# Patient Record
Sex: Female | Born: 1970 | Race: White | Hispanic: No | Marital: Single | Smoking: Current every day smoker
Health system: Southern US, Community
[De-identification: ages and names within clinical notes are randomized; demographics above are authoritative.]

## PROBLEM LIST (undated history)

## (undated) DIAGNOSIS — Z8489 Family history of other specified conditions: Secondary | ICD-10-CM

## (undated) DIAGNOSIS — E119 Type 2 diabetes mellitus without complications: Secondary | ICD-10-CM

## (undated) DIAGNOSIS — G629 Polyneuropathy, unspecified: Secondary | ICD-10-CM

## (undated) DIAGNOSIS — M1990 Unspecified osteoarthritis, unspecified site: Secondary | ICD-10-CM

## (undated) DIAGNOSIS — N189 Chronic kidney disease, unspecified: Secondary | ICD-10-CM

## (undated) DIAGNOSIS — I10 Essential (primary) hypertension: Secondary | ICD-10-CM

## (undated) HISTORY — PX: CHOLECYSTECTOMY: SHX55

## (undated) HISTORY — PX: MOUTH SURGERY: SHX715

## (undated) HISTORY — PX: NECK SURGERY: SHX720

---

## 2004-11-20 ENCOUNTER — Emergency Department: Payer: Self-pay | Admitting: Emergency Medicine

## 2005-07-28 ENCOUNTER — Emergency Department: Payer: Self-pay | Admitting: Unknown Physician Specialty

## 2007-03-12 ENCOUNTER — Ambulatory Visit: Payer: Self-pay | Admitting: Specialist

## 2007-04-29 ENCOUNTER — Inpatient Hospital Stay (HOSPITAL_COMMUNITY): Admission: AD | Admit: 2007-04-29 | Discharge: 2007-04-30 | Payer: Self-pay | Admitting: Neurosurgery

## 2007-09-16 ENCOUNTER — Emergency Department: Payer: Self-pay | Admitting: Internal Medicine

## 2010-09-13 NOTE — Op Note (Signed)
NAME:  Cheryl Sullivan, Cheryl Sullivan               ACCOUNT NO.:  [id number]   MEDICAL RECORD NO.:  [id number]          PATIENT TYPE:  AMB   LOCATION:  SDS                          FACILITY:  [facility]   PHYSICIAN:  Henry A. Pool, M.D.    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   PROCEDURE NOTE:  C5-6 anterior cervical diskectomy and fusion with  allograft and anterior plating.   SURGEON:  Kathaleen Maser. Pool, M.D.   ASSISTANT:  Donalee Citrin, M.D.   ANESTHESIA:  General endotracheal anesthesia.   INDICATIONS FOR PROCEDURE:  Cheryl Sullivan is a 40 year old female with  history of neck and right upper extremity pain, paresthesias and  weakness consistent with a C6 radiculopathy.  Workup demonstrates  evidence of large right-sided C5-6 disk herniation with spinal cord and  C6 nerve root compression.  The patient has been counseled as to her  options.  She was desired to proceed with a C5-6 anterior cervical  diskectomy and fusion with allograft and anterior plating in hopes of  improving her symptoms.   DESCRIPTION OF PROCEDURE:  Patient taken to the operating room and  placed on the operating table in supine position.  After adequate level  of anesthesia was achieved, the patient positioned supine with neck  slightly extended and held in place with halter traction.  The patient's  anterior cervical region was prepped and draped sterilely.  A 10 blade  was used to make a linear skin incision overlying the C5-6 interspace.  This was carried down sharply to the platysma.  The platysma was then  divided vertically and dissection proceeded along the medial border of  the sternocleidomastoid muscle and carotid sheath.  Trachea, esophagus  were mobilized and  retracted towards the left.  Prevertebral fascia was  stripped off the anterior spinal column.  The longus coli muscle was  then elevated bilaterally using electrocautery.  Deep self-retaining  retractor was placed.  Intraoperative fluoroscopy was used and C5-6  level was confirmed.  Disk space was then incised with a 15 blade in  rectangular fashion.  Wide disk space clean-out was then achieved using  pituitary rongeurs, forward and back-biting Carlen curettes, Kerrison  rongeurs, high-speed drill.  All elements of the disk were removed down  to the level of the posterior annulus.  Microscope was then brought into  the field and used throughout the remainder of the diskectomy.  The  remaining aspects of the annulus and osteophytes removed using high-  speed drill down to the level of the posterior longitudinal ligament.  The posterior longitudinal ligament was then elevated and resected in  piecemeal fashion using Kerrison rongeurs.  Underlying thecal sac was  then identified.  Wide central decompression was then performed by  undercutting the bodies of C5 and C6.  Decompression proceeded at each  neural foramen.  Wide anterior foraminotomies were then performed  along  the course of the exiting C6 nerve roots bilaterally.  A large amount of  free disk herniation was encountered off to the right side at C5-6 which  was completely resected.  At this point, a very thorough decompression  had been achieved.  There was no evidence of jury to the thecal sac or  nerve roots.  Wound was then irrigated with antibiotic solution.  Gelfoam was placed topically for hemostasis and found to be good.  A 6  mm LifeNet allograft wedge was then impacted into place and recessed  approximately 1 mL from the anterior cortical margin.  A 25 mm Atlantis  anterior cervical plate was then placed through the C5-C6 levels.  This  then attached under fluoroscopic guidance using 13 mm variable angle  screws, two each  at both levels.  All four screws were given final  tightening and found to be solid within bone.  Locking screws engaged at  both levels.  Final images revealed good position of bone grafts and  hardware, proper operative level and normal alignment of spine.  Wound  was then irrigated with antibiotic solution.  Hemostasis was ensured  with bipolar electrocautery.  Wound was then closed in layers.  Steri-  Strips  and sterile dressings were applied.  There were no apparent  complications.  The patient tolerated the procedure well and she return  to the recovery room postoperative.           ______________________________  Kathaleen Maser. Pool, M.D.     HAP/MEDQ  D:  04/29/2007  T:  04/29/2007  Job:  [id number]

## 2011-02-03 LAB — CBC
MCV: 90.1
Platelets: UNDETERMINED
RBC: 4.25
RDW: 15.5

## 2011-02-03 LAB — DIFFERENTIAL
Basophils Relative: 0
Eosinophils Relative: 3
Lymphocytes Relative: 26

## 2015-09-22 ENCOUNTER — Emergency Department
Admission: EM | Admit: 2015-09-22 | Discharge: 2015-09-22 | Disposition: A | Discharge status: Home / Self Care | Payer: Self-pay | Attending: Emergency Medicine | Admitting: Emergency Medicine

## 2015-09-22 ENCOUNTER — Emergency Department: Payer: Self-pay

## 2015-09-22 ENCOUNTER — Encounter: Payer: Self-pay | Admitting: Emergency Medicine

## 2015-09-22 DIAGNOSIS — W108XXA Fall (on) (from) other stairs and steps, initial encounter: Secondary | ICD-10-CM | POA: Insufficient documentation

## 2015-09-22 DIAGNOSIS — Y939 Activity, unspecified: Secondary | ICD-10-CM | POA: Insufficient documentation

## 2015-09-22 DIAGNOSIS — S7001XA Contusion of right hip, initial encounter: Secondary | ICD-10-CM | POA: Insufficient documentation

## 2015-09-22 DIAGNOSIS — M1990 Unspecified osteoarthritis, unspecified site: Secondary | ICD-10-CM | POA: Insufficient documentation

## 2015-09-22 DIAGNOSIS — M25462 Effusion, left knee: Secondary | ICD-10-CM | POA: Insufficient documentation

## 2015-09-22 DIAGNOSIS — Y999 Unspecified external cause status: Secondary | ICD-10-CM | POA: Insufficient documentation

## 2015-09-22 DIAGNOSIS — F17200 Nicotine dependence, unspecified, uncomplicated: Secondary | ICD-10-CM | POA: Insufficient documentation

## 2015-09-22 DIAGNOSIS — S39012A Strain of muscle, fascia and tendon of lower back, initial encounter: Secondary | ICD-10-CM | POA: Insufficient documentation

## 2015-09-22 DIAGNOSIS — S40021A Contusion of right upper arm, initial encounter: Secondary | ICD-10-CM | POA: Insufficient documentation

## 2015-09-22 DIAGNOSIS — S7011XA Contusion of right thigh, initial encounter: Secondary | ICD-10-CM | POA: Insufficient documentation

## 2015-09-22 DIAGNOSIS — Y929 Unspecified place or not applicable: Secondary | ICD-10-CM | POA: Insufficient documentation

## 2015-09-22 HISTORY — DX: Unspecified osteoarthritis, unspecified site: M19.90

## 2015-09-22 LAB — POCT PREGNANCY, URINE: Preg Test, Ur: NEGATIVE

## 2015-09-22 MED ORDER — KETOROLAC TROMETHAMINE 60 MG/2ML IM SOLN
60.0000 mg | Freq: Once | INTRAMUSCULAR | Status: AC
  Administered 2015-09-22: 60 mg via INTRAMUSCULAR
  Filled 2015-09-22: qty 2

## 2015-09-22 MED ORDER — HYDROCODONE-ACETAMINOPHEN 5-325 MG PO TABS: 1.0000 | ORAL_TABLET | ORAL | Status: DC | PRN

## 2015-09-22 MED ORDER — IBUPROFEN 800 MG PO TABS: 800.0000 mg | ORAL_TABLET | Freq: Three times a day (TID) | ORAL | Status: DC | PRN

## 2015-09-22 MED ORDER — DIAZEPAM 5 MG PO TABS
5.0000 mg | ORAL_TABLET | Freq: Once | ORAL | Status: AC
  Administered 2015-09-22: 5 mg via ORAL
  Filled 2015-09-22: qty 1

## 2015-09-22 NOTE — ED Notes (Signed)
Discussed discharge instructions, prescriptions, and follow-up care with patient. No questions or concerns at this time. Pt stable at discharge.  

## 2015-09-22 NOTE — ED Provider Notes (Signed)
[facility] Emergency Department Provider Note  ____________________________________________  Time seen: Approximately 1:22 PM  I have reviewed the triage vital signs and the nursing notes.   HISTORY  Chief Complaint Fall    HPI Cheryl Sullivan is a 45 y.o. female who presents for evaluation of falling this morning the wet metal steps. Patient states that her legs when different directions and she is complaining of left knee pain, right forearm pain lumbar back pain and right hip and pelvis pain. Describes pain as 8/10. Extremely difficult to ambulate. Has not taken any medications over-the-counter this time.   Past Medical History  Diagnosis Date  . Arthritis     There are no active problems to display for this patient.   Past Surgical History  Procedure Laterality Date  . Cesarean section    . Cholecystectomy    . Neck surgery      Current Outpatient Rx  Name  Route  Sig  Dispense  Refill  . HYDROcodone-acetaminophen (NORCO) 5-325 MG tablet   Oral   Take 1-2 tablets by mouth every 4 (four) hours as needed for moderate pain.   20 tablet   0   . ibuprofen (ADVIL,MOTRIN) 800 MG tablet   Oral   Take 1 tablet (800 mg total) by mouth every 8 (eight) hours as needed.   30 tablet   0     Allergies Aspirin and Prednisone  History reviewed. No pertinent family history.  Social History Social History  Substance Use Topics  . Smoking status: Current Every Day Smoker -- 1.00 packs/day  . Smokeless tobacco: None  . Alcohol Use: No    Review of Systems Constitutional: No fever/chills Cardiovascular: Denies chest pain. Respiratory: Denies shortness of breath. Gastrointestinal: No abdominal pain.  No nausea, no vomiting.  No diarrhea.  No constipation. Genitourinary: Negative for dysuria. Musculoskeletal: Positive for left knee pain. Positive for right hip and pelvis pain. Positive for right forearm pain. Positive for lower back  pain. Skin: Negative for rash. Neurological: Negative for headaches, focal weakness or numbness.  10-point ROS otherwise negative.  ____________________________________________   PHYSICAL EXAM:  VITAL SIGNS: ED Triage Vitals  Enc Vitals Group     BP 09/22/15 1210 118/69 mmHg     Pulse Rate 09/22/15 1210 69     Resp 09/22/15 1210 18     Temp 09/22/15 1210 97.7 F (36.5 C)     Temp Source 09/22/15 1210 Oral     SpO2 09/22/15 1210 97 %     Weight 09/22/15 1210 270 lb (122.471 kg)     Height 09/22/15 1210 5\' 8"  (1.727 m)     Head Cir --      Peak Flow --      Pain Score 09/22/15 1220 7     Pain Loc --      Pain Edu? --      Excl. in [place]? --     Constitutional: Alert and oriented. Well appearing and in no acute distress.   Cardiovascular: Normal rate, regular rhythm. Grossly normal heart sounds.  Good peripheral circulation. Respiratory: Normal respiratory effort.  No retractions. Lungs CTAB. Musculoskeletal: Left knee with positive edema and tenderness. Able to flex knee at approximately 30. Right forearm with abrasions. Range of motion distally neurovascularly intact. Positive point tenderness to the lateral aspect of the right hip. Tenderness to the lumbar spine with straight leg raise negative bilaterally. Neurologic:  Normal speech and language. No gross focal neurologic deficits are  appreciated. No gait instability. Skin:  Skin is warm, dry and intact. No rash noted. Psychiatric: Mood and affect are normal. Speech and behavior are normal.  ____________________________________________   LABS (all labs ordered are listed, but only abnormal results are displayed)  Labs Reviewed  POC URINE PREG, ED  POCT PREGNANCY, URINE    RADIOLOGY   FINDINGS: Moderate knee joint effusion. 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 No advanced joint space narrowing. Question quadriceps tendon injury with small a avulsed component from the patella. No  other sign of fracture.  IMPRESSION: Joint effusion. Small calcification superior to the patella could indicate quadriceps tendon injury with tiny avulsion. __________________________________________   PROCEDURES  Procedure(s) performed: None  Critical Care performed: No  ____________________________________________   INITIAL IMPRESSION / ASSESSMENT AND PLAN / ED COURSE  Pertinent labs & imaging results that were available during my care of the patient were reviewed by me and considered in my medical decision making (see chart for details).  Status post fall with acute left knee effusion. Possible  Quadriceps tendon injury. Rx given for Vicodin 5/325 and ibuprofen 800. Patient follow-up with orthopedics on-call and return to the ER with any worsening symptomology. Patient voices no other emergency medical complaints at this time. ____________________________________________   FINAL CLINICAL IMPRESSION(S) / ED DIAGNOSES  Final diagnoses:  Knee effusion, left  Contusion, hip and thigh, right, initial encounter  Lumbar strain, initial encounter  Arm contusion, right, initial encounter     This chart was dictated using voice recognition software/Dragon. Despite best efforts to proofread, errors can occur which can change the meaning. Any change was purely unintentional.   Arlyss Repress, PA-C 09/22/15 [place], MD 09/23/15 1710

## 2015-09-22 NOTE — Discharge Instructions (Signed)
Cryotherapy °Cryotherapy means treatment with cold. Ice or gel packs can be used to reduce both pain and swelling. Ice is the most helpful within the first 24 to 48 hours after an injury or flare-up from overusing a muscle or joint. Sprains, strains, spasms, burning pain, shooting pain, and aches can all be eased with ice. Ice can also be used when recovering from surgery. Ice is effective, has very few side effects, and is safe for most people to use. °PRECAUTIONS  °Ice is not a safe treatment option for people with: °· Raynaud phenomenon. This is a condition affecting small blood vessels in the extremities. Exposure to cold may cause your problems to return. °· Cold hypersensitivity. There are many forms of cold hypersensitivity, including: °¨ Cold urticaria. Red, itchy hives appear on the skin when the tissues begin to warm after being iced. °¨ Cold erythema. This is a red, itchy rash caused by exposure to cold. °¨ Cold hemoglobinuria. Red blood cells break down when the tissues begin to warm after being iced. The hemoglobin that carry oxygen are passed into the urine because they cannot combine with blood proteins fast enough. °· Numbness or altered sensitivity in the area being iced. °If you have any of the following conditions, do not use ice until you have discussed cryotherapy with your caregiver: °· Heart conditions, such as arrhythmia, angina, or chronic heart disease. °· High blood pressure. °· Healing wounds or open skin in the area being iced. °· Current infections. °· Rheumatoid arthritis. °· Poor circulation. °· Diabetes. °Ice slows the blood flow in the region it is applied. This is beneficial when trying to stop inflamed tissues from spreading irritating chemicals to surrounding tissues. However, if you expose your skin to cold temperatures for too long or without the proper protection, you can damage your skin or nerves. Watch for signs of skin damage due to cold. °HOME CARE INSTRUCTIONS °Follow  these tips to use ice and cold packs safely. °· Place a dry or damp towel between the ice and skin. A damp towel will cool the skin more quickly, so you may need to shorten the time that the ice is used. °· For a more rapid response, add gentle compression to the ice. °· Ice for no more than 10 to 20 minutes at a time. The bonier the area you are icing, the less time it will take to get the benefits of ice. °· Check your skin after 5 minutes to make sure there are no signs of a poor response to cold or skin damage. °· Rest 20 minutes or more between uses. °· Once your skin is numb, you can end your treatment. You can test numbness by very lightly touching your skin. The touch should be so light that you do not see the skin dimple from the pressure of your fingertip. When using ice, most people will feel these normal sensations in this order: cold, burning, aching, and numbness. °· Do not use ice on someone who cannot communicate their responses to pain, such as small children or people with dementia. °HOW TO MAKE AN ICE PACK °Ice packs are the most common way to use ice therapy. Other methods include ice massage, ice baths, and cryosprays. Muscle creams that cause a cold, tingly feeling do not offer the same benefits that ice offers and should not be used as a substitute unless recommended by your caregiver. °To make an ice pack, do one of the following: °· Place crushed ice or a   bag of frozen vegetables in a sealable plastic bag. Squeeze out the excess air. Place this bag inside another plastic bag. Slide the bag into a pillowcase or place a damp towel between your skin and the bag.  Mix 3 parts water with 1 part rubbing alcohol. Freeze the mixture in a sealable plastic bag. When you remove the mixture from the freezer, it will be slushy. Squeeze out the excess air. Place this bag inside another plastic bag. Slide the bag into a pillowcase or place a damp towel between your skin and the bag. SEEK MEDICAL CARE  IF:  You develop white spots on your skin. This may give the skin a blotchy (mottled) appearance.  Your skin turns blue or pale.  Your skin becomes waxy or hard.  Your swelling gets worse. MAKE SURE YOU:   Understand these instructions.  Will watch your condition.  Will get help right away if you are not doing well or get worse.   This information is not intended to replace advice given to you by your health care provider. Make sure you discuss any questions you have with your health care provider.   Document Released: 12/12/2010 Document Revised: 05/08/2014 Document Reviewed: 12/12/2010 Elsevier Interactive Patient Education 2016 [employer]  Iliac Crest Contusion An iliac crest contusion is a deep bruise of your hip bone (hip pointer). Contusions are the result of an injury that caused bleeding under the skin. The contusion may turn blue, purple, or yellow. Minor injuries will give you a painless contusion, but more severe contusions may stay painful and swollen for a few weeks.  CAUSES  An iliac crest contusion is usually caused by a blow to the top of your hip pointer. The injury most often comes from contact sports.  SYMPTOMS   Swelling and redness of the hip area.  Bruising of the hip area.  Tenderness or soreness of the hip. DIAGNOSIS  The diagnosis can be made by taking your history and doing a physical exam. You may need an X-ray of your hip pointer to look for a broken bone (fracture). TREATMENT  Often, the best treatment for an iliac crest contusion is resting, icing, elevating, and applying cold compresses to the injured area. Over-the-counter medicines may also be recommended for pain control. Crutches may be used if walking is very painful. Some people need physical therapy to help with range of motion and strengthening.  HOME CARE INSTRUCTIONS   Put ice on the injured area.  Put ice in a plastic bag.  Place a towel between your skin and the bag.  Leave  the ice on for 15-20 minutes, 03-04 times a day.  Only take over-the-counter or prescription medicines for pain, discomfort, or fever as directed by your caregiver. Your caregiver may recommend avoiding anti-inflammatory medicines (aspirin, ibuprofen, and naproxen) for 48 hours because these medicines may increase bruising.  Keep your leg straightened (extended) when possible.  Walk or move around as the pain allows, or as directed by your caregiver. Use crutches if your caregiver recommends them.  Apply compression wraps as directed by your caregiver. You may remove the wraps for sleeping, showers, and baths. SEEK IMMEDIATE MEDICAL CARE IF:   You have increased bruising or swelling.  You have pain that is getting worse.  Your swelling or pain is not relieved with medicines.  Your toes get cold. MAKE SURE YOU:   Understand these instructions.  Will watch your condition.  Will get help right away if you are not  doing well or get worse.   This information is not intended to replace advice given to you by your health care provider. Make sure you discuss any questions you have with your health care provider.   Document Released: 01/10/2001 Document Revised: 07/10/2011 Document Reviewed: 09/02/2014 Elsevier Interactive Patient Education 2016 [place] therapy can help ease sore, stiff, injured, and tight muscles and joints. Heat relaxes your muscles, which may help ease your pain.  RISKS AND COMPLICATIONS If you have any of the following conditions, do not use heat therapy unless your health care provider has approved:  Poor circulation.  Healing wounds or scarred skin in the area being treated.  Diabetes, heart disease, or high blood pressure.  Not being able to feel (numbness) the area being treated.  Unusual swelling of the area being treated.  Active infections.  Blood clots.  Cancer.  Inability to communicate pain. This may include young  children and people who have problems with their brain function (dementia).  Pregnancy. Heat therapy should only be used on old, pre-existing, or long-lasting (chronic) injuries. Do not use heat therapy on new injuries unless directed by your health care provider. HOW TO USE HEAT THERAPY There are several different kinds of heat therapy, including:  Moist heat pack.  Warm water bath.  Hot water bottle.  Electric heating pad.  Heated gel pack.  Heated wrap.  Electric heating pad. Use the heat therapy method suggested by your health care provider. Follow your health care provider's instructions on when and how to use heat therapy. GENERAL HEAT THERAPY RECOMMENDATIONS  Do not sleep while using heat therapy. Only use heat therapy while you are awake.  Your skin may turn pink while using heat therapy. Do not use heat therapy if your skin turns red.  Do not use heat therapy if you have new pain.  High heat or long exposure to heat can cause burns. Be careful when using heat therapy to avoid burning your skin.  Do not use heat therapy on areas of your skin that are already irritated, such as with a rash or sunburn. SEEK MEDICAL CARE IF:  You have blisters, redness, swelling, or numbness.  You have new pain.  Your pain is worse. MAKE SURE YOU:  Understand these instructions.  Will watch your condition.  Will get help right away if you are not doing well or get worse.   This information is not intended to replace advice given to you by your health care provider. Make sure you discuss any questions you have with your health care provider.   Document Released: 07/10/2011 Document Revised: 05/08/2014 Document Reviewed: 06/10/2013 Elsevier Interactive Patient Education 2016 [employer]  Gluteal Strain A gluteal strain happens when the muscles in the buttocks (gluteal muscles) are overstretched or torn. A tear can be partial or complete. A gluteal strain can cause pain and  stiffness in your buttocks, legs, and lower back. A strain might be referred to as "pulling a muscle." The severity of a gluteal strain is rated in degrees. First-degree strains have the least amount of muscle tearing and pain. Second-degree and third-degrees strains have increasingly more tearing and pain.  CAUSES  There are many possible causes of gluteal strain, such as:   Stretching the muscles too far.   Putting too much stress on the muscles before they are warmed up.   Overusing the muscles.   Repetitive muscle movements over long periods of time.   Injury.  RISK  FACTORS This condition is more likely to develop in:   People who are in cold weather.   People who are physically tired.  People with poor strength and flexibility.   People who do not warm up properly before physical activity.   People who do exercises or play sports with sudden bursts of activity.  People who have poor exercise technique. SYMPTOMS  Symptoms of this condition include:  Pain in the buttocks, especially when moving the legs. Pain may spread to the lower back or the legs.  Bruising and swelling of the buttocks.  Tenderness, weakness, or stiffness in the buttocks.  Muscle spasms. DIAGNOSIS  This condition is diagnosed based on a physical exam and medical history. Your health care provider may do some range of motion exercises with you. You may have tests, such as MRI or X-rays.  Your strain may be rated based on how severe it is. The ratings are:   Grade 1 strain (mild). Your muscles are overstretched. You might have very small muscle tears. This type of strain generally heals in about 1 week.   Grade 2 strain (moderate). Your muscles are partially torn. This may take 1 to 2 months to heal.  Grade 3 strain (severe). Your muscles are completely torn. A severe strain can take more than three months to heal. Grade 3 gluteal strains are rare. TREATMENT  Treatment for this  condition includes resting, icing, and raising (elevating) the injured area as much as possible. Your health care provider may recommend over-the-counter pain medicines. If your gluteal strain is severe or very painful, your health care provider may prescribe pain medicines or physical therapy. Surgery for severe strains is rare. HOME CARE INSTRUCTIONS   Take over-the-counter and prescription medicines only as told by your health care provider.  Do not drive or operate heavy machinery while taking prescription pain medicine.  Return to your normal activities as told by your health care provider. Ask your health care provider what activities are safe for you.  Rest your gluteal muscles as much as possible, especially for the first 2-3 days.  Begin exercising or stretching as told by your health care provider.  If directed, apply ice to the injured area:  Put ice in a plastic bag.  Place a towel between your skin and the bag.  Leave the ice on for 20 minutes, 2-3 times per day.  Keep all follow-up visits as told by your health care provider. This is important. PREVENTION   Warm up and stretch before physical activity.   Stretch after physical activity.   Learn and use correct techniques for exercising and playing sports.  Avoid difficult physical activities if your muscles are tired or sore.   Strengthen your gluteal muscles and the surrounding muscles.   Develop your flexibility by stretching at least once a day.  SEEK MEDICAL CARE IF:   You have pain or swelling that gets worse or does not get better with medicine.   You have "shooting" pain that moves through your leg.  SEEK IMMEDIATE MEDICAL CARE IF:  You develop weakness in part of your body.   You lose feeling in part of your body.  You have severe pain.   You are unable to walk.  You have difficulty controlling your bladder or bowel movements.   This information is not intended to replace advice given  to you by your health care provider. Make sure you discuss any questions you have with your health care provider.  Document Released: 02/12/2009 Document Revised: 01/06/2015 Document Reviewed: 09/02/2014 Elsevier Interactive Patient Education 2016 [employer] Inc.  Knee Effusion Knee effusion means that you have excess fluid in your knee joint. This can cause pain and swelling in your knee. This may make your knee more difficult to bend and move. That is because there is increased pain and pressure in the joint. If there is fluid in your knee, it often means that something is wrong inside your knee, such as severe arthritis, abnormal inflammation, or an infection. Another common cause of knee effusion is an injury to the knee muscles, ligaments, or cartilage. HOME CARE INSTRUCTIONS  Use crutches as directed by your health care provider.  Wear a knee brace as directed by your health care provider.  Apply ice to the swollen area:  Put ice in a plastic bag.  Place a towel between your skin and the bag.  Leave the ice on for 20 minutes, 2-3 times per day.  Keep your knee raised (elevated) when you are sitting or lying down.  Take medicines only as directed by your health care provider.  Do any rehabilitation or strengthening exercises as directed by your health care provider.  Rest your knee as directed by your health care provider. You may start doing your normal activities again when your health care provider approves.   Keep all follow-up visits as directed by your health care provider. This is important. SEEK MEDICAL CARE IF:  You have ongoing (persistent) pain in your knee. SEEK IMMEDIATE MEDICAL CARE IF:  You have increased swelling or redness of your knee.  You have severe pain in your knee.  You have a fever.   This information is not intended to replace advice given to you by your health care provider. Make sure you discuss any questions you have with your health care  provider.   Document Released: 07/08/2003 Document Revised: 05/08/2014 Document Reviewed: 12/01/2013 Elsevier Interactive Patient Education [employer].

## 2015-09-22 NOTE — ED Notes (Signed)
Pt states she had a fall this morning, slipped on wet metal steps, c/o pain for R arm, L knee, and R hip where she landed and L lower back that twisted with fall. Swelling noted to L knee. Denies LOC, denies head involvement.

## 2015-09-22 NOTE — ED Notes (Signed)
Pt reports that she fell this morning and has pain to her right arm, lower back left side, and right hip. Pt denies LOC. Pt alert & oriented with NAD noted.

## 2016-02-15 ENCOUNTER — Encounter: Payer: Self-pay | Admitting: Emergency Medicine

## 2016-02-15 ENCOUNTER — Emergency Department
Admission: EM | Admit: 2016-02-15 | Discharge: 2016-02-15 | Disposition: A | Discharge status: Home / Self Care | Payer: Medicaid Other | Attending: Emergency Medicine | Admitting: Emergency Medicine

## 2016-02-15 DIAGNOSIS — B349 Viral infection, unspecified: Secondary | ICD-10-CM | POA: Insufficient documentation

## 2016-02-15 DIAGNOSIS — F17200 Nicotine dependence, unspecified, uncomplicated: Secondary | ICD-10-CM | POA: Insufficient documentation

## 2016-02-15 DIAGNOSIS — Z791 Long term (current) use of non-steroidal anti-inflammatories (NSAID): Secondary | ICD-10-CM | POA: Insufficient documentation

## 2016-02-15 LAB — INFLUENZA PANEL BY PCR (TYPE A & B)
H1N1FLUPCR: NOT DETECTED
INFLAPCR: NEGATIVE
INFLBPCR: NEGATIVE

## 2016-02-15 MED ORDER — PROMETHAZINE-DM 6.25-15 MG/5ML PO SYRP: 5.0000 mL | ORAL_SOLUTION | Freq: Four times a day (QID) | ORAL | 0 refills | Status: DC | PRN

## 2016-02-15 MED ORDER — IBUPROFEN 600 MG PO TABS: 600.0000 mg | ORAL_TABLET | Freq: Three times a day (TID) | ORAL | 0 refills | Status: DC | PRN

## 2016-02-15 NOTE — ED Provider Notes (Signed)
[facility] Emergency Department Provider Note   ____________________________________________   None    (approximate)  I have reviewed the triage vital signs and the nursing notes.   HISTORY  Chief Complaint No chief complaint on file.   HPI Cheryl Sullivan is a 45 y.o. female with one day history of nasal congestion, non-productive cough, and N/V/D. Pt has decreased appetite, but denies any hematemesis, hematochezia, or dysuria.  Patient reports a subjective fever, body aches, and mild headache relieved with OTC pain medication. She reports several sick contacts (daughter with pneumonia and other daughter with bronchitis) and wanted to "get checked out"   Past Medical History:  Diagnosis Date  . Arthritis     There are no active problems to display for this patient.   Past Surgical History:  Procedure Laterality Date  . CESAREAN SECTION    . CHOLECYSTECTOMY    . NECK SURGERY      Prior to Admission medications   Medication Sig Start Date End Date Taking? Authorizing Provider  HYDROcodone-acetaminophen (NORCO) 5-325 MG tablet Take 1-2 tablets by mouth every 4 (four) hours as needed for moderate pain. 09/22/15   Pierce Crane Beers, PA-C  ibuprofen (ADVIL,MOTRIN) 600 MG tablet Take 1 tablet (600 mg total) by mouth every 8 (eight) hours as needed. 02/15/16   Sable Feil, PA-C  ibuprofen (ADVIL,MOTRIN) 800 MG tablet Take 1 tablet (800 mg total) by mouth every 8 (eight) hours as needed. 09/22/15   Pierce Crane Beers, PA-C  promethazine-dextromethorphan (PROMETHAZINE-DM) 6.25-15 MG/5ML syrup Take 5 mLs by mouth 4 (four) times daily as needed for cough. 02/15/16   Sable Feil, PA-C    Allergies Aspirin and Prednisone  No family history on file.  Social History Social History  Substance Use Topics  . Smoking status: Current Every Day Smoker    Packs/day: 1.00  . Smokeless tobacco: Never Used  . Alcohol use No    Review of  Systems Constitutional: Subjective fever and positive for body aches and chills Eyes: No visual changes. ENT: No sore throat  Cardiovascular: Denies chest pain. Respiratory: Positive for cough Gastrointestinal: Mild abdominal pain. Positive nausea, vomiting, and diarrhea.   Genitourinary: Negative for dysuria. Neurological: Positive for mild headache. No focal weakness or numbness. 10-point ROS otherwise negative.  ____________________________________________   PHYSICAL EXAM:  VITAL SIGNS: ED Triage Vitals  Enc Vitals Group     BP 02/15/16 1304 101/70     Pulse Rate 02/15/16 1304 62     Resp 02/15/16 1304 18     Temp 02/15/16 1304 98.1 F (36.7 C)     Temp Source 02/15/16 1304 Oral     SpO2 02/15/16 1304 99 %     Weight 02/15/16 1308 260 lb (117.9 kg)     Height 02/15/16 1308 5\' 8"  (1.727 m)     Head Circumference --      Peak Flow --      Pain Score --      Pain Loc --      Pain Edu? --      Excl. in [place]? --     Constitutional: Alert and oriented. Well appearing and in no acute distress. Eyes: Conjunctivae are normal. PERRL. EOMI. Head: Atraumatic. Nose: No congestion/rhinnorhea. Mouth/Throat: Mucous membranes are moist.  Oropharynx mild erythema, no exudates noted Neck: No stridor. Hematological/Lymphatic/Immunilogical: No cervical lymphadenopathy. Cardiovascular: Normal rate, regular rhythm. Grossly normal heart sounds.  Good peripheral circulation. Respiratory: Normal respiratory effort.  No retractions.  CTABL Gastrointestinal: Soft and nontender. No distention.  Neurologic:  Normal speech and language. No gross focal neurologic deficits are appreciated. No gait instability. Skin:  Skin is warm, dry and intact. No rash noted. Psychiatric: Mood and affect are normal. Speech and behavior are normal.  ____________________________________________   LABS (all labs ordered are listed, but only abnormal results are displayed)  Labs Reviewed  INFLUENZA PANEL BY PCR  (TYPE A & B, H1N1)   ____________________________________________  EKG  N/A ____________________________________________  RADIOLOGY  N/A ____________________________________________   PROCEDURES  Procedure(s) performed: None  Procedures  Critical Care performed: No  ____________________________________________   INITIAL IMPRESSION / ASSESSMENT AND PLAN / ED COURSE  Pertinent labs & imaging results that were available during my care of the patient were reviewed by me and considered in my medical decision making (see chart for details). Discussed negative rapid flu test results with patient.  Viral illness 1. Influenza PCR- negative  2. Continue supportive care with rest and fluids. Patient advised to eat bland diet as tolerated 3. Phenergan DM for congestion/cough and nausea  4. Ibuprofen for body aches and fever  5. Patient was given work note     Clinical Course     ____________________________________________   FINAL CLINICAL IMPRESSION(S) / ED DIAGNOSES  Final diagnoses:  Viral illness      NEW MEDICATIONS STARTED DURING THIS VISIT:  New Prescriptions   IBUPROFEN (ADVIL,MOTRIN) 600 MG TABLET    Take 1 tablet (600 mg total) by mouth every 8 (eight) hours as needed.   PROMETHAZINE-DEXTROMETHORPHAN (PROMETHAZINE-DM) 6.25-15 MG/5ML SYRUP    Take 5 mLs by mouth 4 (four) times daily as needed for cough.     Note:  This document was prepared using Dragon voice recognition software and may include unintentional dictation errors.   Sable Feil, PA-C 02/15/16 [place], MD 02/16/16 [phone]

## 2016-02-15 NOTE — ED Triage Notes (Signed)
States she is having some chest congestion with cough  States she developed some n/v/d yesterday am around 3 am.. Last time vomited was at 6am  Last diarrhea was about 12 n

## 2016-04-05 ENCOUNTER — Encounter: Payer: Self-pay | Admitting: Emergency Medicine

## 2016-04-05 ENCOUNTER — Emergency Department: Payer: Self-pay

## 2016-04-05 ENCOUNTER — Emergency Department
Admission: EM | Admit: 2016-04-05 | Discharge: 2016-04-05 | Disposition: A | Discharge status: Home / Self Care | Payer: Self-pay | Attending: Emergency Medicine | Admitting: Emergency Medicine

## 2016-04-05 DIAGNOSIS — Z79899 Other long term (current) drug therapy: Secondary | ICD-10-CM | POA: Insufficient documentation

## 2016-04-05 DIAGNOSIS — Z791 Long term (current) use of non-steroidal anti-inflammatories (NSAID): Secondary | ICD-10-CM | POA: Insufficient documentation

## 2016-04-05 DIAGNOSIS — F17200 Nicotine dependence, unspecified, uncomplicated: Secondary | ICD-10-CM | POA: Insufficient documentation

## 2016-04-05 DIAGNOSIS — M25461 Effusion, right knee: Secondary | ICD-10-CM

## 2016-04-05 DIAGNOSIS — M25561 Pain in right knee: Secondary | ICD-10-CM

## 2016-04-05 DIAGNOSIS — M1711 Unilateral primary osteoarthritis, right knee: Secondary | ICD-10-CM | POA: Insufficient documentation

## 2016-04-05 MED ORDER — MELOXICAM 15 MG PO TABS
15.0000 mg | ORAL_TABLET | Freq: Every day | ORAL | 0 refills | Status: DC
Start: 2016-04-05 — End: 2016-10-04

## 2016-04-05 NOTE — ED Provider Notes (Signed)
[facility] Emergency Department Provider Note  ____________________________________________  Time seen: Approximately 3:51 PM  I have reviewed the triage vital signs and the nursing notes.   HISTORY  Chief Complaint Knee Pain    HPI Cheryl Sullivan is a 45 y.o. female who presents emergency department complaining of several weeks' history of right knee pain. Patient states that she is on her feet a lot at work and will have a sharp pain to the medial aspect of the kneewhile she is walking. Patient does report that area is mildly swollen when compared with the unaffected extremity. Patient instructed Tylenol which does help somewhat with pain. Patient denies any direct trauma to knee. No other complaints at this time.   Past Medical History:  Diagnosis Date  . Arthritis     There are no active problems to display for this patient.   Past Surgical History:  Procedure Laterality Date  . CESAREAN SECTION    . CHOLECYSTECTOMY    . NECK SURGERY      Prior to Admission medications   Medication Sig Start Date End Date Taking? Authorizing Provider  HYDROcodone-acetaminophen (NORCO) 5-325 MG tablet Take 1-2 tablets by mouth every 4 (four) hours as needed for moderate pain. 09/22/15   Pierce Crane Beers, PA-C  ibuprofen (ADVIL,MOTRIN) 600 MG tablet Take 1 tablet (600 mg total) by mouth every 8 (eight) hours as needed. 02/15/16   Sable Feil, PA-C  ibuprofen (ADVIL,MOTRIN) 800 MG tablet Take 1 tablet (800 mg total) by mouth every 8 (eight) hours as needed. 09/22/15   Pierce Crane Beers, PA-C  meloxicam (MOBIC) 15 MG tablet Take 1 tablet (15 mg total) by mouth daily. 04/05/16   Charline Bills Aliviyah Malanga, PA-C  promethazine-dextromethorphan (PROMETHAZINE-DM) 6.25-15 MG/5ML syrup Take 5 mLs by mouth 4 (four) times daily as needed for cough. 02/15/16   Sable Feil, PA-C    Allergies Aspirin and Prednisone  No family history on file.  Social History Social History   Substance Use Topics  . Smoking status: Current Every Day Smoker    Packs/day: 1.00  . Smokeless tobacco: Never Used  . Alcohol use No     Review of Systems  Constitutional: No fever/chills Eyes: No visual changes. No discharge ENT: No upper respiratory complaints. Cardiovascular: no chest pain. Respiratory: no cough. No SOB. Gastrointestinal: No abdominal pain.  No nausea, no vomiting.  Musculoskeletal: Positive for right knee pain Skin: Negative for rash, abrasions, lacerations, ecchymosis. Neurological: Negative for headaches, focal weakness or numbness. 10-point ROS otherwise negative.  ____________________________________________   PHYSICAL EXAM:  VITAL SIGNS: ED Triage Vitals [04/05/16 1503]  Enc Vitals Group     BP 111/63     Pulse Rate 64     Resp 16     Temp 98 F (36.7 C)     Temp Source Oral     SpO2 100 %     Weight 240 lb (108.9 kg)     Height 5\' 8"  (1.727 m)     Head Circumference      Peak Flow      Pain Score 8     Pain Loc      Pain Edu?      Excl. in [place]?      Constitutional: Alert and oriented. Well appearing and in no acute distress. Eyes: Conjunctivae are normal. PERRL. EOMI. Head: Atraumatic. Neck: No stridor.    Cardiovascular: Normal rate, regular rhythm. Normal S1 and S2.  Good peripheral circulation. Respiratory:  Normal respiratory effort without tachypnea or retractions. Lungs CTAB. Good air entry to the bases with no decreased or absent breath sounds. Musculoskeletal: Full range of motion to all extremities. No gross deformities appreciated.Mild edema noted to the right knee with her left. No erythema. No warmth to palpation. Patient is mildly tender to palpation over the medial aspect of the knee. No specific point tenderness. No palpable abnormality. No ballottement test. Varus, valgus, Lachman's, McMurray's is negative. Dorsalis pedis pulse intact distally. Sensation intact distally. Neurologic:  Normal speech and language. No gross  focal neurologic deficits are appreciated.  Skin:  Skin is warm, dry and intact. No rash noted. Psychiatric: Mood and affect are normal. Speech and behavior are normal. Patient exhibits appropriate insight and judgement.   ____________________________________________   LABS (all labs ordered are listed, but only abnormal results are displayed)  Labs Reviewed - No data to display ____________________________________________  EKG   ____________________________________________  RADIOLOGY Diamantina Providence Zyren Sevigny, personally viewed and evaluated these images (plain radiographs) as part of my medical decision making, as well as reviewing the written report by the radiologist.  Dg Knee Complete 4 Views Right  Result Date: 04/05/2016 CLINICAL DATA:  Sharp right lateral knee pain for 2 weeks. EXAM: RIGHT KNEE - COMPLETE 4+ VIEW COMPARISON:  None FINDINGS: There is slight narrowing of the lateral compartment with small marginal osteophytes. No fracture or dislocation. Moderate joint effusion. IMPRESSION: Moderate joint effusion. Slight arthritic changes of the lateral compartment. Electronically Signed   By: Lorriane Shire M.D.   On: 04/05/2016 15:41    ____________________________________________    PROCEDURES  Procedure(s) performed:    Procedures    Medications - No data to display   ____________________________________________   INITIAL IMPRESSION / ASSESSMENT AND PLAN / ED COURSE  Pertinent labs & imaging results that were available during my care of the patient were reviewed by me and considered in my medical decision making (see chart for details).  Review of the [place] CSRS was performed in accordance of the [place] prior to dispensing any controlled drugs.  Clinical Course     Patient's diagnosis is consistent with Knee pain likely due to arthritis and mild joint effusion. X-ray reveals moderate arthritic changes and joint effusion. Exam is reassuring. No further  imaging necessary at this time.. Patient will be discharged home with prescriptions for anti-inflammatory control. If symptoms persist, patient will follow-up with orthopedics for aspiration.. Patient is given ED precautions to return to the ED for any worsening or new symptoms.     ____________________________________________  FINAL CLINICAL IMPRESSION(S) / ED DIAGNOSES  Final diagnoses:  Acute pain of right knee  Primary osteoarthritis of right knee  Knee effusion, right      NEW MEDICATIONS STARTED DURING THIS VISIT:  New Prescriptions   MELOXICAM (MOBIC) 15 MG TABLET    Take 1 tablet (15 mg total) by mouth daily.        This chart was dictated using voice recognition software/Dragon. Despite best efforts to proofread, errors can occur which can change the meaning. Any change was purely unintentional.    Darletta Moll, PA-C 04/05/16 Livermore Quigley, MD 04/05/16 [phone]

## 2016-04-05 NOTE — ED Triage Notes (Signed)
Pt c/o right knee pain "for a while", reports swelling and increased pain. Pt reports taking tylenol this AM with no relief.

## 2016-07-08 ENCOUNTER — Encounter: Payer: Self-pay | Admitting: Emergency Medicine

## 2016-07-08 ENCOUNTER — Emergency Department
Admission: EM | Admit: 2016-07-08 | Discharge: 2016-07-08 | Disposition: A | Discharge status: Home / Self Care | Payer: Self-pay | Attending: Student in an Organized Health Care Education/Training Program | Admitting: Student in an Organized Health Care Education/Training Program

## 2016-07-08 DIAGNOSIS — M545 Low back pain, unspecified: Secondary | ICD-10-CM

## 2016-07-08 DIAGNOSIS — F17200 Nicotine dependence, unspecified, uncomplicated: Secondary | ICD-10-CM | POA: Insufficient documentation

## 2016-07-08 MED ORDER — NAPROXEN 500 MG PO TABS: 500.0000 mg | ORAL_TABLET | Freq: Two times a day (BID) | ORAL | 0 refills | Status: DC

## 2016-07-08 MED ORDER — KETOROLAC TROMETHAMINE 60 MG/2ML IM SOLN
30.0000 mg | Freq: Once | INTRAMUSCULAR | Status: AC
  Administered 2016-07-08: 30 mg via INTRAMUSCULAR
  Filled 2016-07-08: qty 2

## 2016-07-08 NOTE — ED Provider Notes (Signed)
[facility] Emergency Department Provider Note  ____________________________________________  Time seen: Approximately 11:00 AM  I have reviewed the triage vital signs and the nursing notes.   HISTORY  Chief Complaint Back Pain    HPI Cheryl Sullivan is a 46 y.o. female presents to the emergency department with one day of low back pain. Patient states that yesterday while at work she began to have pain in the middle of her lower back. Pain does not radiate. Pain is sharp in character. Patient works at [employer] and pain worsened while being on her feet all day. She states that she had kidney infections and muscle strains in the past and this feels different.No trauma. Patient has taken Tylenol for pain, which has not helped. Patient denies fever, shortness of breath, chest pain, nausea, vomiting, abdominal pain, bowel or bladder dysfunction, saddle paresthesias, dysuria, urgency, frequency, vaginal discharge, numbness, tingling.   Past Medical History:  Diagnosis Date  . Arthritis     There are no active problems to display for this patient.   Past Surgical History:  Procedure Laterality Date  . CESAREAN SECTION    . CHOLECYSTECTOMY    . NECK SURGERY      Prior to Admission medications   Medication Sig Start Date End Date Taking? Authorizing Provider  HYDROcodone-acetaminophen (NORCO) 5-325 MG tablet Take 1-2 tablets by mouth every 4 (four) hours as needed for moderate pain. 09/22/15   Pierce Crane Beers, PA-C  ibuprofen (ADVIL,MOTRIN) 600 MG tablet Take 1 tablet (600 mg total) by mouth every 8 (eight) hours as needed. 02/15/16   Sable Feil, PA-C  ibuprofen (ADVIL,MOTRIN) 800 MG tablet Take 1 tablet (800 mg total) by mouth every 8 (eight) hours as needed. 09/22/15   Pierce Crane Beers, PA-C  meloxicam (MOBIC) 15 MG tablet Take 1 tablet (15 mg total) by mouth daily. 04/05/16   Charline Bills Cuthriell, PA-C  naproxen (NAPROSYN) 500 MG tablet Take 1 tablet (500 mg  total) by mouth 2 (two) times daily with a meal. 07/08/16 07/08/17  Laban Emperor, PA-C  promethazine-dextromethorphan (PROMETHAZINE-DM) 6.25-15 MG/5ML syrup Take 5 mLs by mouth 4 (four) times daily as needed for cough. 02/15/16   Sable Feil, PA-C    Allergies Aspirin and Prednisone  History reviewed. No pertinent family history.  Social History Social History  Substance Use Topics  . Smoking status: Current Every Day Smoker    Packs/day: 1.00  . Smokeless tobacco: Never Used  . Alcohol use No     Review of Systems  Constitutional: No fever/chills ENT: No upper respiratory complaints. Cardiovascular: No chest pain. Respiratory: No cough. No SOB. Gastrointestinal: No abdominal pain.  No nausea, no vomiting.  Genitourinary: Negative for dysuria. Skin: Negative for rash, abrasions, lacerations, ecchymosis. Neurological: Negative for headaches, numbness or tingling   ____________________________________________   PHYSICAL EXAM:  VITAL SIGNS: ED Triage Vitals [07/08/16 0919]  Enc Vitals Group     BP (!) 105/45     Pulse Rate 73     Resp 20     Temp 98.2 F (36.8 C)     Temp Source Oral     SpO2 98 %     Weight 240 lb (108.9 kg)     Height      Head Circumference      Peak Flow      Pain Score 8     Pain Loc      Pain Edu?      Excl. in [place]?  Constitutional: Alert and oriented. Well appearing and in no acute distress. Eyes: Conjunctivae are normal. PERRL. EOMI. Head: Atraumatic. ENT:      Ears:      Nose: No congestion/rhinnorhea.      Mouth/Throat: Mucous membranes are moist.  Neck: No stridor.   Cardiovascular: Normal rate, regular rhythm.  Good peripheral circulation. Respiratory: Normal respiratory effort without tachypnea or retractions. Lungs CTAB. Good air entry to the bases with no decreased or absent breath sounds. Gastrointestinal: Bowel sounds 4 quadrants. Soft and nontender to palpation. No guarding or rigidity. No palpable masses. No  distention. No CVA tenderness. Musculoskeletal: Full range of motion to all extremities. No gross deformities appreciated. Negative straight leg raise, cross leg raise, Faber test. Strength 5 out of 5. Neurologic:  Normal speech and language. No gross focal neurologic deficits are appreciated.  Skin:  Skin is warm, dry and intact. No rash noted.   ____________________________________________   LABS (all labs ordered are listed, but only abnormal results are displayed)  Labs Reviewed - No data to display ____________________________________________  EKG   ____________________________________________  RADIOLOGY   No results found.  ____________________________________________    PROCEDURES  Procedure(s) performed:    Procedures    Medications  ketorolac (TORADOL) injection 30 mg (30 mg Intramuscular Given 07/08/16 1037)     ____________________________________________   INITIAL IMPRESSION / ASSESSMENT AND PLAN / ED COURSE  Pertinent labs & imaging results that were available during my care of the patient were reviewed by me and considered in my medical decision making (see chart for details).  Review of the [place] CSRS was performed in accordance of the [place] prior to dispensing any controlled drugs.     Patient's diagnosis is consistent with acute low back pain. Vital signs and exam are reassuring. Patient agrees that x-ray is unlikely to show anything at this time. No trauma. Patient will be discharged home with prescriptions for naproxen. Patient will follow up with PCP in one week if symptoms are not improving. Patient is given ED precautions to return to the ED for any worsening or new symptoms. No evidence of cauda hematoma, vascular catastrophe, spinal abscess/hematoma, or referred intra-abdominal pain. All patients questions questions were answered.      ____________________________________________  FINAL CLINICAL IMPRESSION(S) / ED DIAGNOSES  Final  diagnoses:  Acute midline low back pain without sciatica      NEW MEDICATIONS STARTED DURING THIS VISIT:  New Prescriptions   NAPROXEN (NAPROSYN) 500 MG TABLET    Take 1 tablet (500 mg total) by mouth 2 (two) times daily with a meal.        This chart was dictated using voice recognition software/Dragon. Despite best efforts to proofread, errors can occur which can change the meaning. Any change was purely unintentional.    Laban Emperor, PA-C 07/08/16 1112    Merlyn Lot, MD 07/08/16 [phone]

## 2016-07-08 NOTE — ED Triage Notes (Signed)
Pt to ed with c/o mid lower back pain,  Pt states started yesterday while she was at work, worse today. Reports pain with ambulation and sitting.

## 2016-07-08 NOTE — ED Notes (Signed)
Pt verbalized understanding of discharge instructions. NAD at this time. 

## 2016-09-22 ENCOUNTER — Encounter: Payer: Self-pay | Admitting: Emergency Medicine

## 2016-09-22 ENCOUNTER — Emergency Department
Admission: EM | Admit: 2016-09-22 | Discharge: 2016-09-22 | Disposition: A | Discharge status: Home / Self Care | Payer: Self-pay | Attending: Emergency Medicine | Admitting: Emergency Medicine

## 2016-09-22 DIAGNOSIS — Z79899 Other long term (current) drug therapy: Secondary | ICD-10-CM | POA: Insufficient documentation

## 2016-09-22 DIAGNOSIS — R110 Nausea: Secondary | ICD-10-CM | POA: Insufficient documentation

## 2016-09-22 DIAGNOSIS — K529 Noninfective gastroenteritis and colitis, unspecified: Secondary | ICD-10-CM | POA: Insufficient documentation

## 2016-09-22 DIAGNOSIS — F17200 Nicotine dependence, unspecified, uncomplicated: Secondary | ICD-10-CM | POA: Insufficient documentation

## 2016-09-22 HISTORY — DX: Polyneuropathy, unspecified: G62.9

## 2016-09-22 LAB — URINALYSIS, COMPLETE (UACMP) WITH MICROSCOPIC
BILIRUBIN URINE: NEGATIVE
Glucose, UA: NEGATIVE mg/dL
HGB URINE DIPSTICK: NEGATIVE
KETONES UR: NEGATIVE mg/dL
LEUKOCYTES UA: NEGATIVE
NITRITE: NEGATIVE
PROTEIN: 30 mg/dL — AB
Specific Gravity, Urine: 1.026 (ref 1.005–1.030)
pH: 5 (ref 5.0–8.0)

## 2016-09-22 LAB — COMPREHENSIVE METABOLIC PANEL
ALT: 13 U/L — ABNORMAL LOW (ref 14–54)
ANION GAP: 6 (ref 5–15)
AST: 21 U/L (ref 15–41)
Albumin: 4.1 g/dL (ref 3.5–5.0)
Alkaline Phosphatase: 54 U/L (ref 38–126)
BILIRUBIN TOTAL: 1.7 mg/dL — AB (ref 0.3–1.2)
BUN: 8 mg/dL (ref 6–20)
CHLORIDE: 109 mmol/L (ref 101–111)
CO2: 23 mmol/L (ref 22–32)
Calcium: 8.6 mg/dL — ABNORMAL LOW (ref 8.9–10.3)
Creatinine, Ser: 0.67 mg/dL (ref 0.44–1.00)
GFR calc Af Amer: >60 mL/min (ref >60)
Glucose, Bld: 89 mg/dL (ref 65–99)
POTASSIUM: 3.6 mmol/L (ref 3.5–5.1)
Sodium: 138 mmol/L (ref 135–145)
TOTAL PROTEIN: 6.8 g/dL (ref 6.5–8.1)

## 2016-09-22 LAB — CBC
HEMATOCRIT: 36.5 % (ref 35.0–47.0)
HEMOGLOBIN: 12.9 g/dL (ref 12.0–16.0)
MCH: 31.2 pg (ref 26.0–34.0)
MCHC: 35.5 g/dL (ref 32.0–36.0)
MCV: 87.9 fL (ref 80.0–100.0)
Platelets: 150 10*3/uL (ref 150–440)
RBC: 4.15 MIL/uL (ref 3.80–5.20)
RDW: 17.4 % — ABNORMAL HIGH (ref 11.5–14.5)
WBC: 5.2 10*3/uL (ref 3.6–11.0)

## 2016-09-22 LAB — LIPASE, BLOOD: Lipase: 23 U/L (ref 11–51)

## 2016-09-22 MED ORDER — ONDANSETRON 4 MG PO TBDP: 4.0000 mg | ORAL_TABLET | Freq: Three times a day (TID) | ORAL | 0 refills | Status: DC | PRN

## 2016-09-22 MED ORDER — ONDANSETRON HCL 4 MG/2ML IJ SOLN
4.0000 mg | Freq: Once | INTRAMUSCULAR | Status: AC
  Administered 2016-09-22: 4 mg via INTRAVENOUS
  Filled 2016-09-22 (×2): qty 2

## 2016-09-22 MED ORDER — SODIUM CHLORIDE 0.9 % IV SOLN
1000.0000 mL | Freq: Once | INTRAVENOUS | Status: AC
  Administered 2016-09-22: 1000 mL via INTRAVENOUS

## 2016-09-22 MED ORDER — MORPHINE SULFATE (PF) 4 MG/ML IV SOLN
2.0000 mg | Freq: Once | INTRAVENOUS | Status: AC
  Administered 2016-09-22: 2 mg via INTRAVENOUS
  Filled 2016-09-22: qty 1

## 2016-09-22 NOTE — ED Provider Notes (Signed)
[facility] Emergency Department Provider Note   ____________________________________________    I have reviewed the triage vital signs and the nursing notes.   HISTORY  Chief Complaint Back Pain; Nausea; and Diarrhea     HPI Cheryl Sullivan is a 46 y.o. female who presents with complaints of nausea and diarrhea. Patient reports this morning she began feeling nauseated and has not been able to tolerate by mouth's, she denies abdominal pain but reports multiple episodes of brown diarrhea. Denies fevers but is having spasms in her back in an area where she typically has pain. She reports her daughter was sick with a viral gastroenteritis 2 days ago and was seen in the emergency department. No recent travel. No abdominal pain. No neuro deficits   Past Medical History:  Diagnosis Date  . Arthritis   . Neuropathy     There are no active problems to display for this patient.   Past Surgical History:  Procedure Laterality Date  . CESAREAN SECTION    . CHOLECYSTECTOMY    . NECK SURGERY      Prior to Admission medications   Medication Sig Start Date End Date Taking? Authorizing Provider  HYDROcodone-acetaminophen (NORCO) 5-325 MG tablet Take 1-2 tablets by mouth every 4 (four) hours as needed for moderate pain. 09/22/15   Beers, Pierce Crane, PA-C  ibuprofen (ADVIL,MOTRIN) 600 MG tablet Take 1 tablet (600 mg total) by mouth every 8 (eight) hours as needed. 02/15/16   Sable Feil, PA-C  ibuprofen (ADVIL,MOTRIN) 800 MG tablet Take 1 tablet (800 mg total) by mouth every 8 (eight) hours as needed. 09/22/15   Beers, Pierce Crane, PA-C  meloxicam (MOBIC) 15 MG tablet Take 1 tablet (15 mg total) by mouth daily. 04/05/16   Cuthriell, Charline Bills, PA-C  naproxen (NAPROSYN) 500 MG tablet Take 1 tablet (500 mg total) by mouth 2 (two) times daily with a meal. 07/08/16 07/08/17  Laban Emperor, PA-C  ondansetron (ZOFRAN ODT) 4 MG disintegrating tablet Take 1 tablet (4 mg  total) by mouth every 8 (eight) hours as needed for nausea or vomiting. 09/22/16   Lavonia Drafts, MD  promethazine-dextromethorphan (PROMETHAZINE-DM) 6.25-15 MG/5ML syrup Take 5 mLs by mouth 4 (four) times daily as needed for cough. 02/15/16   Sable Feil, PA-C     Allergies Aspirin and Prednisone  History reviewed. No pertinent family history.  Social History Social History  Substance Use Topics  . Smoking status: Current Every Day Smoker    Packs/day: 1.00  . Smokeless tobacco: Never Used  . Alcohol use No    Review of Systems  Constitutional: No fever/chills Eyes: No visual changes.  ENT: No sore throat. Cardiovascular: Denies chest pain. Respiratory: Denies shortness of breath. Gastrointestinal: As above Genitourinary: Negative for dysuria. No frequency Musculoskeletal: As above Skin: Negative for rash. Neurological: Negative for headaches or weakness   ____________________________________________   PHYSICAL EXAM:  VITAL SIGNS: ED Triage Vitals  Enc Vitals Group     BP 09/22/16 1114 99/60     Pulse Rate 09/22/16 1114 80     Resp --      Temp 09/22/16 1114 98 F (36.7 C)     Temp Source 09/22/16 1114 Oral     SpO2 09/22/16 1114 98 %     Weight --      Height 09/22/16 1114 1.727 m (5\' 8" )     Head Circumference --      Peak Flow --  Pain Score 09/22/16 1113 8     Pain Loc --      Pain Edu? --      Excl. in [place]? --     Constitutional: Alert and oriented. No acute distress. Pleasant and interactive Eyes: Conjunctivae are normal.   Nose: No congestion/rhinnorhea. Mouth/Throat: Mucous membranes are moist.    Cardiovascular: Normal rate, regular rhythm. Grossly normal heart sounds.  Good peripheral circulation. Respiratory: Normal respiratory effort.  No retractions. Lungs CTAB. Gastrointestinal: Soft and nontender. No distention.  No CVA tenderness. Genitourinary: deferred Musculoskeletal: Back: Tenderness to palpation left lumbar lower  paraspinal area consistent with muscle spasm.  No vertebral tenderness to palpation .Warm and well perfused extremities Neurologic:  Normal speech and language. No gross focal neurologic deficits are appreciated.  Skin:  Skin is warm, dry and intact. No rash noted. Psychiatric: Mood and affect are normal. Speech and behavior are normal.  ____________________________________________   LABS (all labs ordered are listed, but only abnormal results are displayed)  Labs Reviewed  COMPREHENSIVE METABOLIC PANEL - Abnormal; Notable for the following:       Result Value   Calcium 8.6 (*)    ALT 13 (*)    Total Bilirubin 1.7 (*)    All other components within normal limits  CBC - Abnormal; Notable for the following:    RDW 17.4 (*)    All other components within normal limits  URINALYSIS, COMPLETE (UACMP) WITH MICROSCOPIC - Abnormal; Notable for the following:    Color, Urine AMBER (*)    APPearance CLEAR (*)    Protein, ur 30 (*)    Bacteria, UA RARE (*)    Squamous Epithelial / LPF 0-5 (*)    All other components within normal limits  LIPASE, BLOOD   ____________________________________________  EKG  None ____________________________________________  RADIOLOGY  None ____________________________________________   PROCEDURES  Procedure(s) performed: No    Critical Care performed: No ____________________________________________   INITIAL IMPRESSION / ASSESSMENT AND PLAN / ED COURSE  Pertinent labs & imaging results that were available during my care of the patient were reviewed by me and considered in my medical decision making (see chart for details).  Patient's symptoms are most consistent with viral gastroenteritis, likely obtained from her daughter. Her abdominal exam is reassuring and overall she is well-appearing. We will treat with IV fluids IV Zofran and small dose of morphine for her back pain.  Patient felt significantly better after treatment. She reports she  is ready to go home, she would like to go home with nausea medication which is certainly reasonable. She notes she can return if any change or worsening in her symptoms    ____________________________________________   FINAL CLINICAL IMPRESSION(S) / ED DIAGNOSES  Final diagnoses:  Gastroenteritis      NEW MEDICATIONS STARTED DURING THIS VISIT:  Discharge Medication List as of 09/22/2016  4:47 PM    START taking these medications   Details  ondansetron (ZOFRAN ODT) 4 MG disintegrating tablet Take 1 tablet (4 mg total) by mouth every 8 (eight) hours as needed for nausea or vomiting., Starting Fri 09/22/2016, Print         Note:  This document was prepared using Dragon voice recognition software and may include unintentional dictation errors.    Lavonia Drafts, MD 09/22/16 [phone]

## 2016-09-22 NOTE — ED Notes (Signed)
Pt discharged home after verbalizing understanding of discharge instructions; nad noted. 

## 2016-09-22 NOTE — ED Triage Notes (Addendum)
Pt to ED with c/o of lower back pain that started yesterday and diarrhea that started this morning. Pt denies injury to back. Pt ambulatory to triage.

## 2016-09-22 NOTE — ED Notes (Signed)
Pt presents with lower back pain, n/v/d since 0400. Affirms hx of sciatica. Denies urinary symptoms. One episode of emesis, 7 of diarrhea in last 24 hours. Pt alert & oriented. NAD noted.

## 2016-10-04 ENCOUNTER — Encounter: Payer: Self-pay | Admitting: Emergency Medicine

## 2016-10-04 ENCOUNTER — Emergency Department
Admission: EM | Admit: 2016-10-04 | Discharge: 2016-10-04 | Disposition: A | Discharge status: Home / Self Care | Payer: Self-pay | Attending: Emergency Medicine | Admitting: Emergency Medicine

## 2016-10-04 ENCOUNTER — Emergency Department: Payer: Self-pay

## 2016-10-04 DIAGNOSIS — M545 Low back pain, unspecified: Secondary | ICD-10-CM

## 2016-10-04 DIAGNOSIS — Z87891 Personal history of nicotine dependence: Secondary | ICD-10-CM | POA: Insufficient documentation

## 2016-10-04 DIAGNOSIS — G8929 Other chronic pain: Secondary | ICD-10-CM | POA: Insufficient documentation

## 2016-10-04 MED ORDER — IBUPROFEN 600 MG PO TABS: 600.0000 mg | ORAL_TABLET | Freq: Three times a day (TID) | ORAL | 0 refills | Status: AC | PRN

## 2016-10-04 MED ORDER — TRAMADOL HCL 50 MG PO TABS: 50.0000 mg | ORAL_TABLET | Freq: Four times a day (QID) | ORAL | 0 refills | Status: AC | PRN

## 2016-10-04 NOTE — ED Triage Notes (Signed)
Patient presents to the ED with lower back pain.  Patient states she had issues with the same last week and was seen and pain improved but this morning patient states, "I think I strained it when I was putting on my clothes this morning."  Patient has history of chronic pain problems.  Patient appears uncomfortable.

## 2016-10-04 NOTE — ED Provider Notes (Signed)
[facility] Emergency Department Provider Note  ____________________________________________   First MD Initiated Contact with Patient 10/04/16 1225     (approximate)  I have reviewed the triage vital signs and the nursing notes.   HISTORY  Chief Complaint Back Pain    HPI Cheryl Sullivan is a 46 y.o. female is her complaint of lower back pain. Patient states that she was seen last week for gastroenteritis and at that time had some low back pain. She states that she also strained her back this morning getting dressed. Patient has a history of chronic pain problems. She denies any urinary symptoms, incontinence of bowel or bladder, or paresthesias. Patient does appear uncomfortable. She states that her daughter is out in the parking lot and will not come into the hospital. Currently she rates her pain as an 8 out of 10. She does not have a PCP in this area she is waiting to get insurance. She states her back has not been x-rayed many years and she does not have a doctor to take care of her back pain.   Past Medical History:  Diagnosis Date  . Arthritis   . Neuropathy     There are no active problems to display for this patient.   Past Surgical History:  Procedure Laterality Date  . CESAREAN SECTION    . CHOLECYSTECTOMY    . NECK SURGERY      Prior to Admission medications   Medication Sig Start Date End Date Taking? Authorizing Provider  ibuprofen (ADVIL,MOTRIN) 600 MG tablet Take 1 tablet (600 mg total) by mouth every 8 (eight) hours as needed. 10/04/16   Johnn Hai, PA-C  traMADol (ULTRAM) 50 MG tablet Take 1 tablet (50 mg total) by mouth every 6 (six) hours as needed. 10/04/16   Johnn Hai, PA-C    Allergies Aspirin and Prednisone  No family history on file.  Social History Social History  Substance Use Topics  . Smoking status: Current Every Day Smoker    Packs/day: 1.00  . Smokeless tobacco: Never Used  . Alcohol use No     Review of Systems  Constitutional: No fever/chills Cardiovascular: Denies chest pain. Respiratory: Denies shortness of breath. Gastrointestinal: No abdominal pain.  No nausea, no vomiting.  Genitourinary: Negative for dysuria. Musculoskeletal: Positive for acute and chronic back pain. Skin: Negative for rash. Neurological: Negative for headaches, focal weakness or numbness.   ____________________________________________   PHYSICAL EXAM:  VITAL SIGNS: ED Triage Vitals [10/04/16 1204]  Enc Vitals Group     BP 118/68     Pulse Rate 73     Resp 18     Temp 98.3 F (36.8 C)     Temp Source Oral     SpO2 99 %     Weight 270 lb (122.5 kg)     Height 5\' 8"  (1.727 m)     Head Circumference      Peak Flow      Pain Score 8     Pain Loc      Pain Edu?      Excl. in [place]?     Constitutional: Alert and oriented. Well appearing and in no acute distress. Eyes: Conjunctivae are normal. PERRL. EOMI. Head: Atraumatic. Neck: No stridor.  No cervical tenderness on palpation posteriorly. Cardiovascular: Normal rate, regular rhythm. Grossly normal heart sounds.  Good peripheral circulation. Respiratory: Normal respiratory effort.  No retractions. Lungs CTAB. Gastrointestinal: Obese, soft, nontender. Bowel sounds normoactive 4 quadrants.  Musculoskeletal: Moves upper and lower extremities without any difficulty. Patient is able to ambulate without assistance. Neurologic:  Normal speech and language. No gross focal neurologic deficits are appreciated. No gait instability. Skin:  Skin is warm, dry and intact. Psychiatric: Mood and affect are normal. Speech and behavior are normal.  ____________________________________________   LABS (all labs ordered are listed, but only abnormal results are displayed)  Labs Reviewed - No data to display ____________________________________________  RADIOLOGY  Dg Lumbar Spine 2-3 Views  Result Date: 10/04/2016 CLINICAL DATA:  Back pain, no  injury EXAM: LUMBAR SPINE - 2-3 VIEW COMPARISON:  09/22/2015 FINDINGS: Five lumbar type vertebral bodies. Normal lumbar lordosis. No evidence of fracture or dislocation. Vertebral body heights and intervertebral disc spaces are maintained. Visualized bony pelvis appears intact. Cholecystectomy clips. IMPRESSION: Negative. Electronically Signed   By: Julian Hy M.D.   On: 10/04/2016 13:13    ____________________________________________   PROCEDURES  Procedure(s) performed: None  Procedures  Critical Care performed: No  ____________________________________________   INITIAL IMPRESSION / ASSESSMENT AND PLAN / ED COURSE  Pertinent labs & imaging results that were available during my care of the patient were reviewed by me and considered in my medical decision making (see chart for details).  Patient has both acute and chronic back pain. Her back is most likely exacerbated by her illness from last week. Patient was given a limited amount of tramadol to take 1 every 6 hours as needed for pain. She is also given a prescription for ibuprofen 600 mg 3 times a day with food. She is to follow-up with [facility]  clinic acute care if any continued problems while waiting to get a primary care doctor. She was also encouraged to use ice or heat to her back as needed.      ____________________________________________   FINAL CLINICAL IMPRESSION(S) / ED DIAGNOSES  Final diagnoses:  Acute bilateral low back pain without sciatica  Chronic bilateral low back pain without sciatica      NEW MEDICATIONS STARTED DURING THIS VISIT:  Discharge Medication List as of 10/04/2016  1:36 PM    START taking these medications   Details  traMADol (ULTRAM) 50 MG tablet Take 1 tablet (50 mg total) by mouth every 6 (six) hours as needed., Starting Wed 10/04/2016, Print         Note:  This document was prepared using Dragon voice recognition software and may include unintentional dictation errors.     Johnn Hai, PA-C 10/04/16 1454    Drenda Freeze, MD 10/04/16 [phone]

## 2016-10-04 NOTE — Discharge Instructions (Signed)
Follow-up with [facility] clinic acute-care if any continued problems. You will need to get a primary care doctor to take over the care of your chronic back pain. Begin taking ibuprofen 600 mg 3 times a day with food. Tramadol 1 every 6 hours as needed for severe pain. You may also use heat or ice to your back.

## 2016-10-04 NOTE — ED Notes (Signed)
Pt discharged home after verbalizing understanding of discharge instructions; nad noted. 

## 2016-10-06 ENCOUNTER — Emergency Department
Admission: EM | Admit: 2016-10-06 | Discharge: 2016-10-06 | Disposition: A | Discharge status: Home / Self Care | Payer: Medicaid Other | Attending: Emergency Medicine | Admitting: Emergency Medicine

## 2016-10-06 ENCOUNTER — Encounter: Payer: Self-pay | Admitting: Emergency Medicine

## 2016-10-06 DIAGNOSIS — M1990 Unspecified osteoarthritis, unspecified site: Secondary | ICD-10-CM | POA: Insufficient documentation

## 2016-10-06 DIAGNOSIS — Z9049 Acquired absence of other specified parts of digestive tract: Secondary | ICD-10-CM | POA: Insufficient documentation

## 2016-10-06 DIAGNOSIS — G8929 Other chronic pain: Secondary | ICD-10-CM

## 2016-10-06 DIAGNOSIS — F17200 Nicotine dependence, unspecified, uncomplicated: Secondary | ICD-10-CM | POA: Insufficient documentation

## 2016-10-06 DIAGNOSIS — M5441 Lumbago with sciatica, right side: Secondary | ICD-10-CM | POA: Insufficient documentation

## 2016-10-06 MED ORDER — GABAPENTIN 300 MG PO CAPS: ORAL_CAPSULE | ORAL | 0 refills | Status: DC

## 2016-10-06 NOTE — ED Notes (Signed)
See triage note  States she was seen 2 days ago with lower back pain which radiates into right leg  States pain is not any better conts to take ibu/tramadol w/o relief  No urinary sx's or trauma  Ambulates slowly to treatment room

## 2016-10-06 NOTE — Discharge Instructions (Signed)
Call [facility] for follow up  Begin taking gabapentin 1 tablet today and then one twice a day until seen at [facility]. Continue using ice or heat to her lower back. You may also continue ibuprofen with this medication.

## 2016-10-06 NOTE — ED Triage Notes (Signed)
C/O lower back pain.  Denies injury.  Seen through ED a couple of days ago and given ibuprofen and tramadol.  Patient returns today because pain is to right lower back and radiates down right leg.  Denies dysuria.  Last BM 10/05/2016

## 2016-10-06 NOTE — ED Provider Notes (Signed)
[facility] Emergency Department Provider Note  ____________________________________________   First MD Initiated Contact with Patient 10/06/16 [phone]     (approximate)  I have reviewed the triage vital signs and the nursing notes.   HISTORY  Chief Complaint Back Pain    HPI Cheryl Sullivan is a 46 y.o. female is here with complaint of continued back pain. Patient has experienced chronic back pain for years. Patient was seen in the emergency room on 10/04/16 which time she is given prescriptions for tramadol and ibuprofen. She states that now she is having radiation into her right leg. She denies any incontinence of bowel or bladder or saddle anesthesias. Patient continues to ambulate without assistance. Patient has not followed up with anyone as she has not gotten her Medicaid approval and will be unable to see anyone until after this is obtained. She again denies any urinary symptoms or history of kidney stone. She rates her pain as an 8/10.   Past Medical History:  Diagnosis Date  . Arthritis   . Neuropathy     There are no active problems to display for this patient.   Past Surgical History:  Procedure Laterality Date  . CESAREAN SECTION    . CHOLECYSTECTOMY    . NECK SURGERY      Prior to Admission medications   Medication Sig Start Date End Date Taking? Authorizing Provider  gabapentin (NEURONTIN) 300 MG capsule Take one tablet today and then begin 1 tab bid 10/06/16   Johnn Hai, PA-C  ibuprofen (ADVIL,MOTRIN) 600 MG tablet Take 1 tablet (600 mg total) by mouth every 8 (eight) hours as needed. 10/04/16   Johnn Hai, PA-C  traMADol (ULTRAM) 50 MG tablet Take 1 tablet (50 mg total) by mouth every 6 (six) hours as needed. 10/04/16   Johnn Hai, PA-C    Allergies Aspirin and Prednisone  No family history on file.  Social History Social History  Substance Use Topics  . Smoking status: Current Every Day Smoker    Packs/day:  1.00  . Smokeless tobacco: Never Used  . Alcohol use No    Review of Systems  Constitutional: No fever/chills Cardiovascular: Denies chest pain. Respiratory: Denies shortness of breath. Gastrointestinal: No abdominal pain.  No nausea, no vomiting.  Genitourinary: Negative for dysuria. Musculoskeletal: Positive for low back pain. Positive for right leg radiculopathy. Skin: Negative for rash. Neurological: Negative for headaches   ____________________________________________   PHYSICAL EXAM:  VITAL SIGNS: ED Triage Vitals  Enc Vitals Group     BP 10/06/16 0823 130/79     Pulse Rate 10/06/16 0823 68     Resp 10/06/16 0823 16     Temp 10/06/16 0823 97.8 F (36.6 C)     Temp Source 10/06/16 0823 Oral     SpO2 10/06/16 0823 99 %     Weight 10/06/16 0822 270 lb (122.5 kg)     Height 10/06/16 0822 5\' 8"  (1.727 m)     Head Circumference --      Peak Flow --      Pain Score 10/06/16 0822 8     Pain Loc --      Pain Edu? --      Excl. in [place]? --     Constitutional: Alert and oriented. Well appearing and in no acute distress.Obese. Eyes: Conjunctivae are normal. PERRL. EOMI. Head: Atraumatic. Neck: No stridor.   Cardiovascular: Normal rate, regular rhythm. Grossly normal heart sounds.  Good peripheral circulation. Respiratory:  Normal respiratory effort.  No retractions. Lungs CTAB. Gastrointestinal: Soft and nontender. No distention.  Musculoskeletal: On examination back there is no gross deformity noted however there is diffuse tenderness throughout and patient's range of motion is restricted secondary to her discomfort. Patient walks with a guarded slow gait. There is no point tenderness except for the right SI joint area. Patient has good muscle strength bilaterally in lower extremities. Neurologic:  Normal speech and language. No gross focal neurologic deficits are appreciated. No gait instability. Skin:  Skin is warm, dry and intact. No rash noted. Psychiatric: Mood and  affect are normal. Speech and behavior are normal.  ____________________________________________   LABS (all labs ordered are listed, but only abnormal results are displayed)  Labs Reviewed - No data to display  RADIOLOGY  Lumbar spine x-ray was done on 10/04/16 ____________________________________________   PROCEDURES  Procedure(s) performed: None  Procedures  Critical Care performed: No  ____________________________________________   INITIAL IMPRESSION / ASSESSMENT AND PLAN / ED COURSE  Pertinent labs & imaging results that were available during my care of the patient were reviewed by me and considered in my medical decision making (see chart for details).  Patient states that she is not getting any relief of her sciatic type pain with tramadol and ibuprofen. Patient states that she is allergic to prednisone and had a rash and swelling the last time she was placed on it and was told not to take it anymore. We discussed several options. Patient was given a prescription for Neurontin 300 mg one today that began one twice a day until she is able to [facility]. She states she is unable to see any specialist until her Medicaid has been approved. She will continue using moist heat or ice to her back as needed for pain.    ____________________________________________   FINAL CLINICAL IMPRESSION(S) / ED DIAGNOSES  Final diagnoses:  Chronic right-sided low back pain with right-sided sciatica      NEW MEDICATIONS STARTED DURING THIS VISIT:  Discharge Medication List as of 10/06/2016  9:17 AM    START taking these medications   Details  gabapentin (NEURONTIN) 300 MG capsule Take one tablet today and then begin 1 tab bid, Print         Note:  This document was prepared using Dragon voice recognition software and may include unintentional dictation errors.    Johnn Hai, PA-C 10/06/16 [place], Randall An, MD 10/06/16  [phone]

## 2016-12-12 DIAGNOSIS — M549 Dorsalgia, unspecified: Secondary | ICD-10-CM | POA: Insufficient documentation

## 2016-12-12 DIAGNOSIS — Z5321 Procedure and treatment not carried out due to patient leaving prior to being seen by health care provider: Secondary | ICD-10-CM | POA: Insufficient documentation

## 2016-12-13 ENCOUNTER — Emergency Department
Admission: EM | Admit: 2016-12-13 | Discharge: 2016-12-13 | Disposition: A | Discharge status: Home / Self Care | Payer: Self-pay | Attending: Emergency Medicine | Admitting: Emergency Medicine

## 2016-12-13 LAB — POCT PREGNANCY, URINE: Preg Test, Ur: NEGATIVE

## 2016-12-13 NOTE — ED Triage Notes (Signed)
Patient with onset of left lower back pain. Denies injury. Denies dysuria, urgency or frequency. States her urine is a dark color. States pain is more of a spasm than a pain.

## 2016-12-14 ENCOUNTER — Emergency Department
Admission: EM | Admit: 2016-12-14 | Discharge: 2016-12-14 | Disposition: A | Discharge status: Home / Self Care | Payer: Self-pay | Attending: Emergency Medicine | Admitting: Emergency Medicine

## 2016-12-14 ENCOUNTER — Encounter: Payer: Self-pay | Admitting: Emergency Medicine

## 2016-12-14 DIAGNOSIS — F17200 Nicotine dependence, unspecified, uncomplicated: Secondary | ICD-10-CM | POA: Insufficient documentation

## 2016-12-14 DIAGNOSIS — M62830 Muscle spasm of back: Secondary | ICD-10-CM | POA: Insufficient documentation

## 2016-12-14 DIAGNOSIS — Z79899 Other long term (current) drug therapy: Secondary | ICD-10-CM | POA: Insufficient documentation

## 2016-12-14 LAB — URINALYSIS, COMPLETE (UACMP) WITH MICROSCOPIC
Bacteria, UA: NONE SEEN
Bilirubin Urine: NEGATIVE
GLUCOSE, UA: NEGATIVE mg/dL
Hgb urine dipstick: NEGATIVE
Ketones, ur: NEGATIVE mg/dL
Leukocytes, UA: NEGATIVE
Nitrite: NEGATIVE
PH: 5 (ref 5.0–8.0)
Protein, ur: NEGATIVE mg/dL
RBC / HPF: NONE SEEN RBC/hpf (ref 0–5)
Specific Gravity, Urine: 1.016 (ref 1.005–1.030)

## 2016-12-14 MED ORDER — LIDOCAINE 5 % EX PTCH
1.0000 | MEDICATED_PATCH | Freq: Once | CUTANEOUS | Status: DC
  Administered 2016-12-14: 1 via TRANSDERMAL

## 2016-12-14 MED ORDER — CYCLOBENZAPRINE HCL 10 MG PO TABS: 10.0000 mg | ORAL_TABLET | Freq: Three times a day (TID) | ORAL | 0 refills | Status: AC | PRN

## 2016-12-14 MED ORDER — LIDOCAINE 5 % EX PTCH
MEDICATED_PATCH | CUTANEOUS | Status: AC
  Filled 2016-12-14: qty 1

## 2016-12-14 MED ORDER — OXYCODONE-ACETAMINOPHEN 5-325 MG PO TABS: 1.0000 | ORAL_TABLET | Freq: Four times a day (QID) | ORAL | 0 refills | Status: AC | PRN

## 2016-12-14 NOTE — Discharge Instructions (Signed)
Please take your Percocet and Flexeril as needed for severe pain and make an appointment to establish care with a primary care physician in the next week for reevaluation. Return to the emergency department for any concerns such as numbness, weakness, if you cannot urinate or defecate, or for any other issues whatsoever.  It was a pleasure to take care of you today, and thank you for coming to our emergency department.  If you have any questions or concerns before leaving please ask the nurse to grab me and I'm more than happy to go through your aftercare instructions again.  If you were prescribed any opioid pain medication today such as Norco, Vicodin, Percocet, morphine, hydrocodone, or oxycodone please make sure you do not drive when you are taking this medication as it can alter your ability to drive safely.  If you have any concerns once you are home that you are not improving or are in fact getting worse before you can make it to your follow-up appointment, please do not hesitate to call 911 and come back for further evaluation.  Darel Hong, MD  Results for orders placed or performed during the hospital encounter of 12/14/16  Urinalysis, Complete w Microscopic  Result Value Ref Range   Color, Urine YELLOW (A) YELLOW   APPearance CLEAR (A) CLEAR   Specific Gravity, Urine 1.016 1.005 - 1.030   pH 5.0 5.0 - 8.0   Glucose, UA NEGATIVE NEGATIVE mg/dL   Hgb urine dipstick NEGATIVE NEGATIVE   Bilirubin Urine NEGATIVE NEGATIVE   Ketones, ur NEGATIVE NEGATIVE mg/dL   Protein, ur NEGATIVE NEGATIVE mg/dL   Nitrite NEGATIVE NEGATIVE   Leukocytes, UA NEGATIVE NEGATIVE   RBC / HPF NONE SEEN 0 - 5 RBC/hpf   WBC, UA 0-5 0 - 5 WBC/hpf   Bacteria, UA NONE SEEN NONE SEEN   Squamous Epithelial / LPF 0-5 (A) NONE SEEN   Mucous PRESENT

## 2016-12-14 NOTE — ED Triage Notes (Signed)
Pt in via POV with with complaints of left flank pain and urinary urgency x approximately 2 days.  Pt denies any hx of kidney stones.  Vitals WDL, NAD noted at this time.

## 2016-12-14 NOTE — ED Provider Notes (Signed)
[facility] Emergency Department Provider Note  ____________________________________________   First MD Initiated Contact with Patient 12/14/16 1151     (approximate)  I have reviewed the triage vital signs and the nursing notes.   HISTORY  Chief Complaint Flank Pain    HPI Cheryl Sullivan is a 46 y.o. female who self presents to the emergency department with 2-3 days of moderate to severe cramping intermittent left flank pain. Nonradiating. She does have a long-standing history of right-sided sciatica but is never had pain on the left before. No bowel or bladder incontinence or hesitancy. She has normal perianal sensation.No numbness or weakness. No abdominal pain nausea or vomiting. Her pain seems to be worse with twisting or lifting and improved with rest. She has taken ibuprofen with minimal relief. She's had no fevers or chills. She denies IV drug use or steroid use.   Past Medical History:  Diagnosis Date  . Arthritis   . Neuropathy     There are no active problems to display for this patient.   Past Surgical History:  Procedure Laterality Date  . CESAREAN SECTION    . CHOLECYSTECTOMY    . NECK SURGERY      Prior to Admission medications   Medication Sig Start Date End Date Taking? Authorizing Provider  gabapentin (NEURONTIN) 300 MG capsule Take one tablet today and then begin 1 tab bid Patient taking differently: Take 300 mg by mouth 2 (two) times daily as needed.  10/06/16  Yes Letitia Neri L, PA-C  ibuprofen (ADVIL,MOTRIN) 200 MG tablet Take 200 mg by mouth every 6 (six) hours as needed.   Yes [provider]  cyclobenzaprine (FLEXERIL) 10 MG tablet Take 1 tablet (10 mg total) by mouth 3 (three) times daily as needed for muscle spasms. 12/14/16   Darel Hong, MD  ibuprofen (ADVIL,MOTRIN) 600 MG tablet Take 1 tablet (600 mg total) by mouth every 8 (eight) hours as needed. Patient not taking: Reported on 12/14/2016 10/04/16    Johnn Hai, PA-C  oxyCODONE-acetaminophen (ROXICET) 5-325 MG tablet Take 1 tablet by mouth every 6 (six) hours as needed for severe pain. 12/14/16   Darel Hong, MD  traMADol (ULTRAM) 50 MG tablet Take 1 tablet (50 mg total) by mouth every 6 (six) hours as needed. Patient not taking: Reported on 12/14/2016 10/04/16   Johnn Hai, PA-C    Allergies Prednisone and Aspirin  No family history on file.  Social History Social History  Substance Use Topics  . Smoking status: Current Every Day Smoker    Packs/day: 1.00  . Smokeless tobacco: Never Used  . Alcohol use No    Review of Systems Constitutional: No fever/chills Eyes: No visual changes. ENT: No sore throat. Cardiovascular: Denies chest pain. Respiratory: Denies shortness of breath. Gastrointestinal: No abdominal pain.  No nausea, no vomiting.  No diarrhea.  No constipation. Genitourinary: Negative for dysuria. Musculoskeletal: Positive for back pain. Skin: Negative for rash. Neurological: Negative for headaches, focal weakness or numbness.   ____________________________________________   PHYSICAL EXAM:  VITAL SIGNS: ED Triage Vitals  Enc Vitals Group     BP --      Pulse Rate 12/14/16 1117 72     Resp 12/14/16 1117 20     Temp 12/14/16 1117 98.4 F (36.9 C)     Temp Source 12/14/16 1117 Oral     SpO2 12/14/16 1117 99 %     Weight 12/14/16 1118 275 lb (124.7 kg)  Height 12/14/16 1118 5\' 8"  (1.727 m)     Head Circumference --      Peak Flow --      Pain Score 12/14/16 1116 7     Pain Loc --      Pain Edu? --      Excl. in [place]? --     Constitutional: Alert and oriented 4 appears extremely uncomfortable wincing and grimacing in pain Eyes: PERRL EOMI. Head: Atraumatic. Nose: No congestion/rhinnorhea. Mouth/Throat: No trismus Neck: No stridor.   Cardiovascular: Normal rate, regular rhythm. Grossly normal heart sounds.  Good peripheral circulation. Respiratory: Normal respiratory effort.  No  retractions. Lungs CTAB and moving good air Gastrointestinal: Obese soft nontender no palpable masses Musculoskeletal: Exquisite tenderness left lumbar paraspinal spasm no midline tenderness 55 hip flexion and hip extension plantar flexion dorsiflexion sensation intact to light touch throughout 2+ DTRs no ankle clonus negative straight raising crossed she is exquisitely uncomfortable with internal and external rotation of left hip none with the right Neurologic:  Normal speech and language. No gross focal neurologic deficits are appreciated. Skin:  Skin is warm, dry and intact. No rash noted. Psychiatric: Mood and affect are normal. Speech and behavior are normal.    ____________________________________________   DIFFERENTIAL includes but not limited to muscle spasm, musculoskeletal pain, sciatica, cauda equina, radicular pain ____________________________________________   LABS (all labs ordered are listed, but only abnormal results are displayed)  Labs Reviewed  URINALYSIS, COMPLETE (UACMP) WITH MICROSCOPIC - Abnormal; Notable for the following:       Result Value   Color, Urine YELLOW (*)    APPearance CLEAR (*)    Squamous Epithelial / LPF 0-5 (*)    All other components within normal limits    No evidence of urinary tract infection __________________________________________  EKG   ____________________________________________  RADIOLOGY   ____________________________________________   PROCEDURES  Procedure(s) performed: no  Procedures  Critical Care performed: no  Observation: no ____________________________________________   INITIAL IMPRESSION / ASSESSMENT AND PLAN / ED COURSE  Pertinent labs & imaging results that were available during my care of the patient were reviewed by me and considered in my medical decision making (see chart for details).  The patient is neuro intact with focal muscle spasm and discomfort when ranging her left hip which is most  consistent with musculoskeletal left low back pain. At a lengthy discussion with the patient regarding symptomatic treatment and that given the severity of her symptoms I would recommend a short course of opioid pain medication. I offered her pain medication here today however when asked if she had driven here she did drive along with her 47 year old daughter and together we both agreed is not safe for her to have mind altering medications here today as she does not have a ride. I will instead prescribe her Percocet and Flexeril for use at home. She is discharged home in stable condition.      ____________________________________________   FINAL CLINICAL IMPRESSION(S) / ED DIAGNOSES  Final diagnoses:  Back spasm      NEW MEDICATIONS STARTED DURING THIS VISIT:  Discharge Medication List as of 12/14/2016 12:22 PM    START taking these medications   Details  cyclobenzaprine (FLEXERIL) 10 MG tablet Take 1 tablet (10 mg total) by mouth 3 (three) times daily as needed for muscle spasms., Starting Thu 12/14/2016, Print    oxyCODONE-acetaminophen (ROXICET) 5-325 MG tablet Take 1 tablet by mouth every 6 (six) hours as needed for severe pain., Starting Thu 12/14/2016,  Print         Note:  This document was prepared using Dragon voice recognition software and may include unintentional dictation errors.     Darel Hong, MD 12/15/16 [phone]

## 2018-08-19 IMAGING — CR DG KNEE COMPLETE 4+V*R*
4 series · 4 of 4 positions shown · non-contrast
Comparison: None

CLINICAL DATA: Sharp right lateral knee pain for 2 weeks.

EXAM:
RIGHT KNEE - COMPLETE 4+ VIEW

[knee ap]
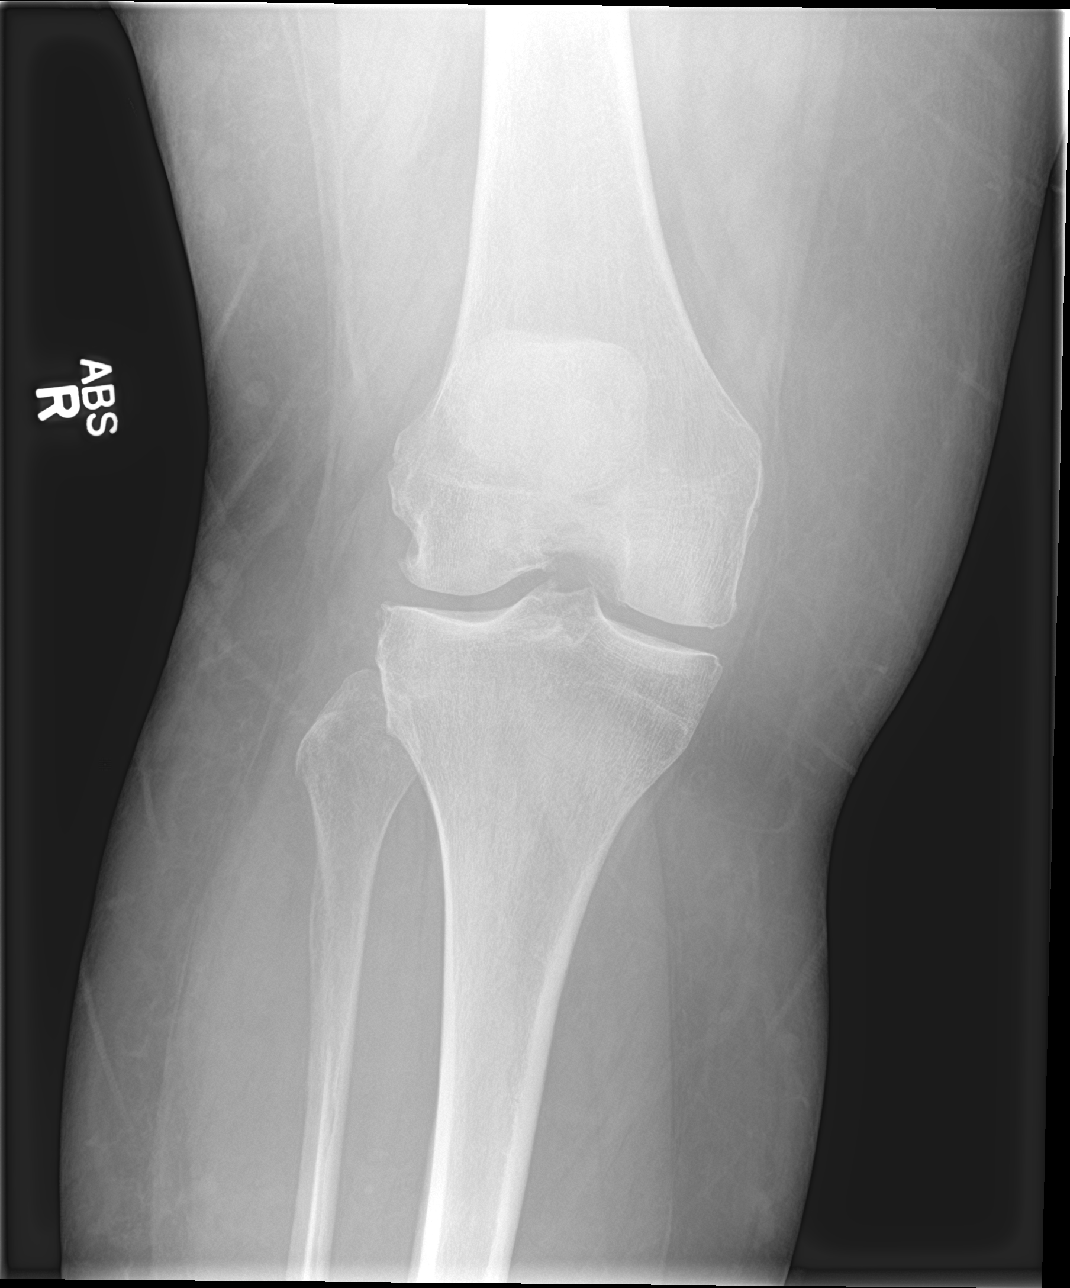

[knee obl (1 of 2)]
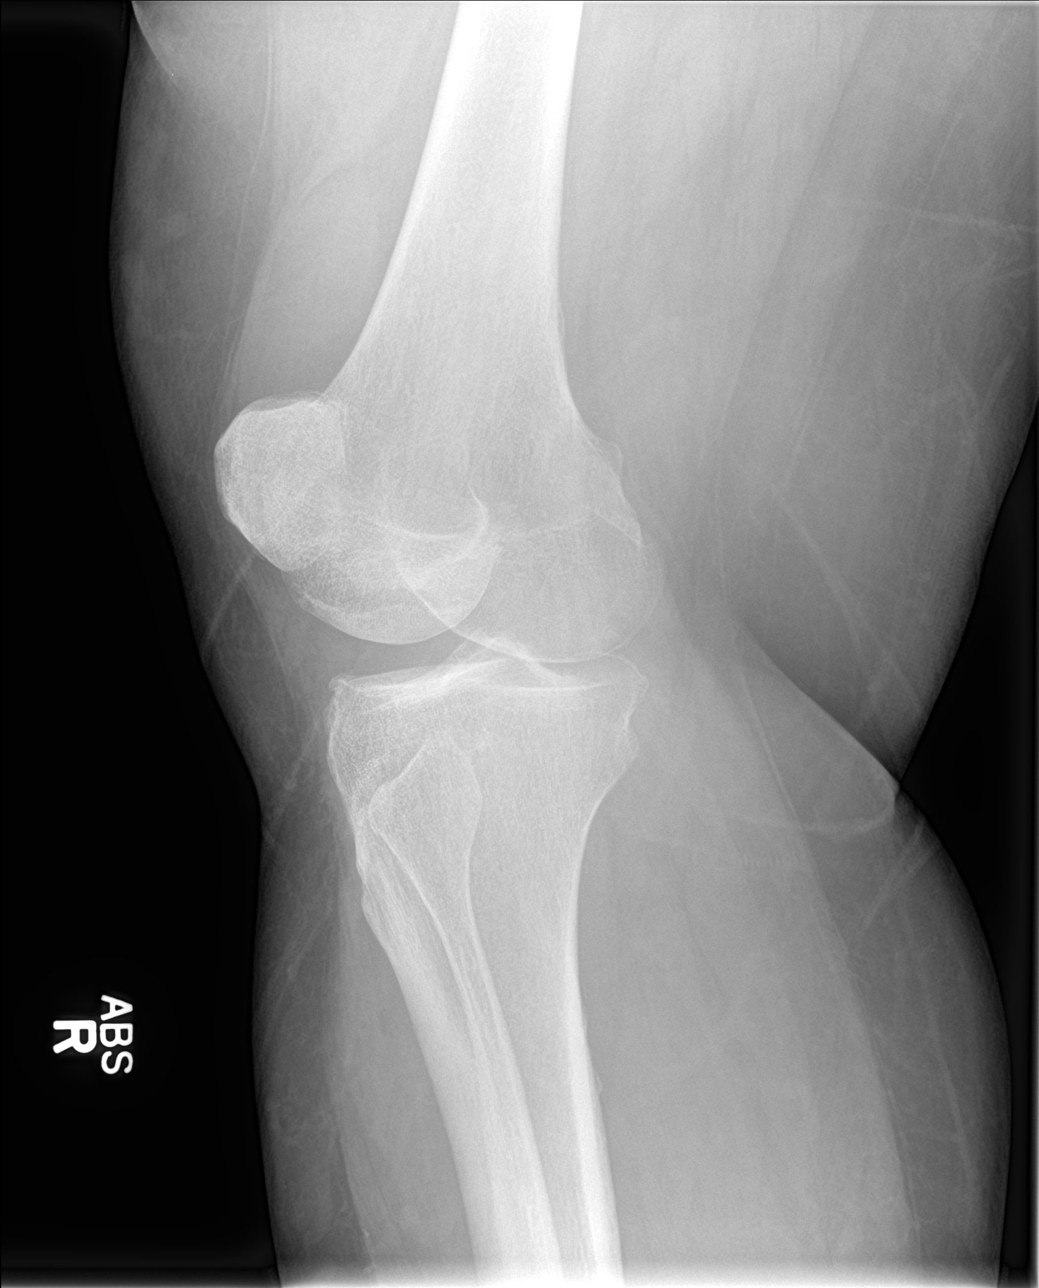

[knee obl (2 of 2)]
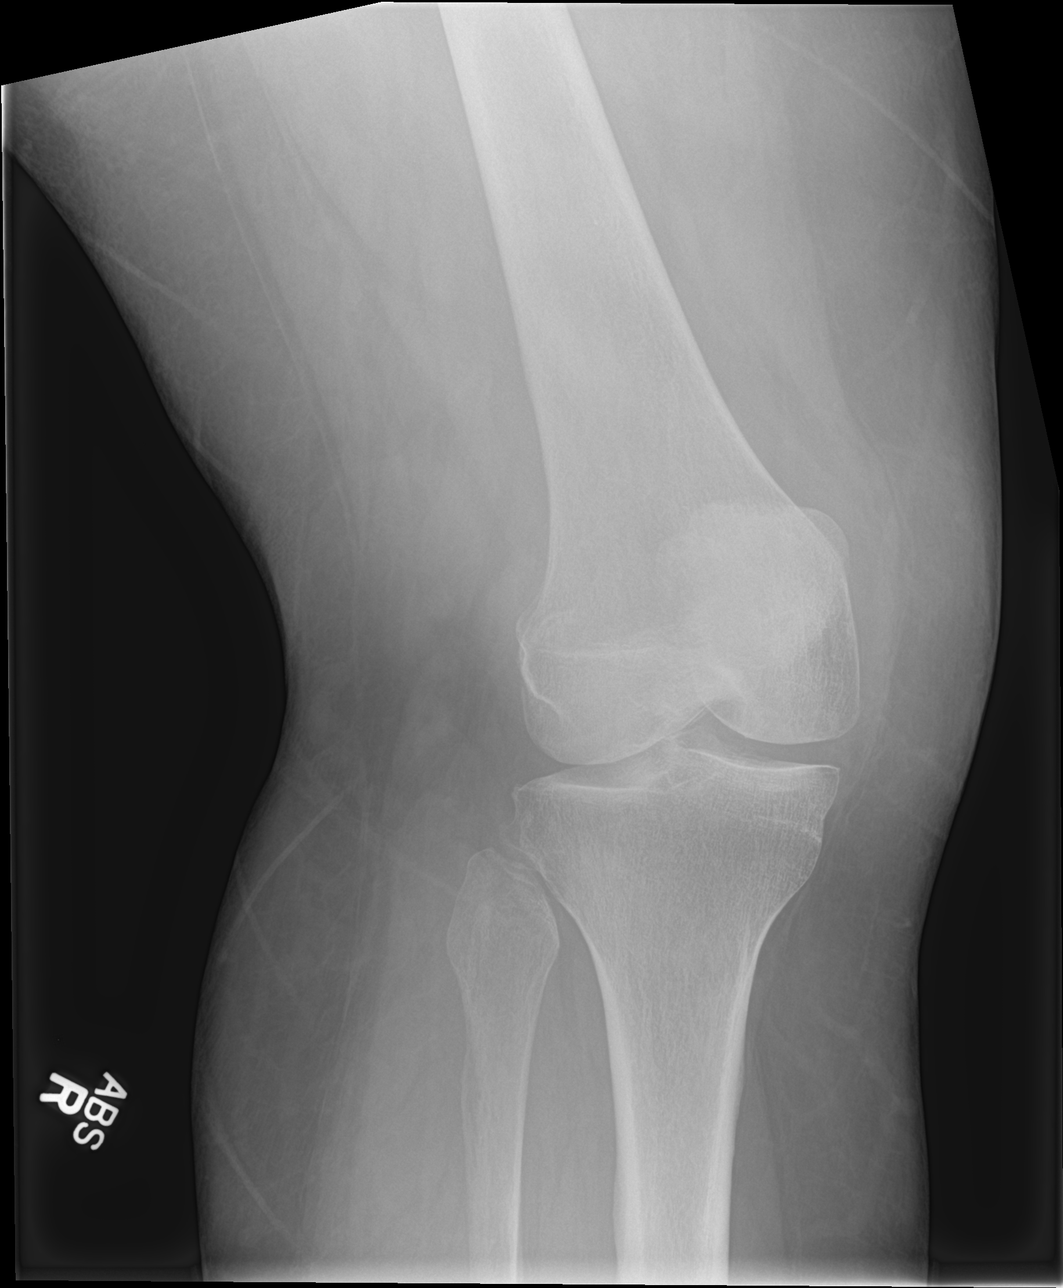

[knee lat]
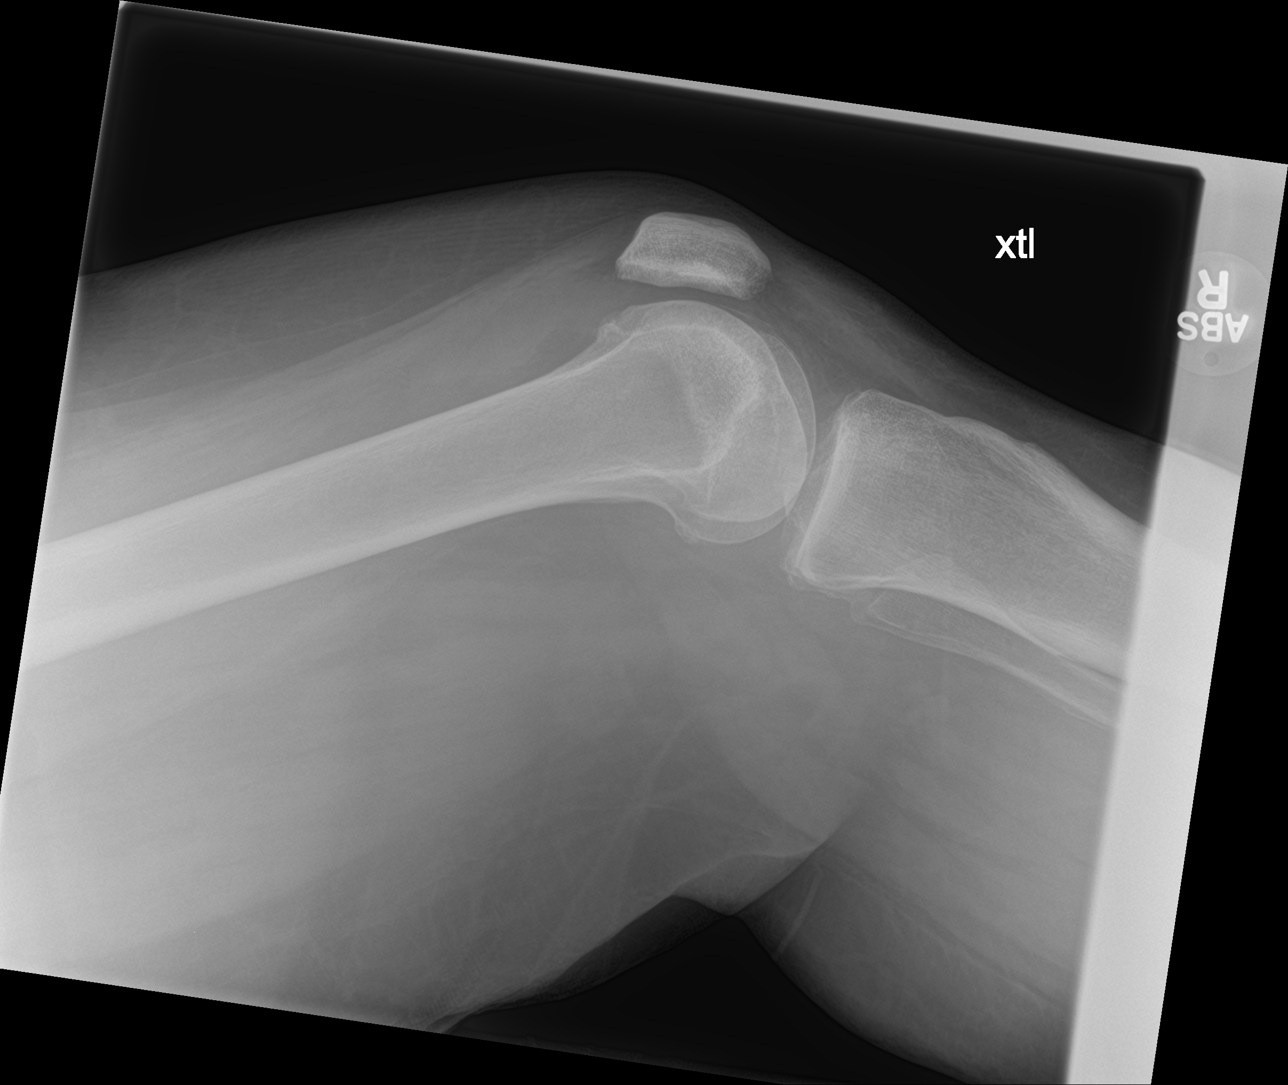

[4 of 4 positions shown; findings below may reference images not displayed]

FINDINGS: There is slight narrowing of the lateral compartment with small
marginal osteophytes. No fracture or dislocation. Moderate joint
effusion.
IMPRESSION: Moderate joint effusion. Slight arthritic changes of the lateral
compartment.

## 2019-11-06 ENCOUNTER — Other Ambulatory Visit: Payer: Self-pay | Admitting: Nephrology

## 2019-11-06 DIAGNOSIS — N179 Acute kidney failure, unspecified: Secondary | ICD-10-CM

## 2019-11-12 ENCOUNTER — Ambulatory Visit: Admission: RE | Admit: 2019-11-12 | Payer: Medicaid Other | Source: Ambulatory Visit

## 2019-11-24 ENCOUNTER — Ambulatory Visit: Payer: Medicaid Other

## 2019-11-26 ENCOUNTER — Ambulatory Visit: Payer: Medicaid Other

## 2019-12-08 ENCOUNTER — Other Ambulatory Visit: Payer: Self-pay

## 2019-12-08 ENCOUNTER — Ambulatory Visit
Admission: RE | Admit: 2019-12-08 | Discharge: 2019-12-08 | Disposition: A | Discharge status: Home / Self Care | Payer: Medicaid Other | Source: Ambulatory Visit | Attending: Nephrology | Admitting: Nephrology

## 2019-12-08 DIAGNOSIS — N179 Acute kidney failure, unspecified: Secondary | ICD-10-CM | POA: Insufficient documentation

## 2020-02-25 ENCOUNTER — Other Ambulatory Visit: Payer: Self-pay

## 2020-02-25 ENCOUNTER — Encounter
Admission: RE | Admit: 2020-02-25 | Discharge: 2020-02-25 | Disposition: A | Discharge status: Home / Self Care | Payer: Medicaid Other | Source: Ambulatory Visit | Attending: Obstetrics and Gynecology | Admitting: Obstetrics and Gynecology

## 2020-02-25 DIAGNOSIS — E119 Type 2 diabetes mellitus without complications: Secondary | ICD-10-CM | POA: Insufficient documentation

## 2020-02-25 DIAGNOSIS — Z01818 Encounter for other preprocedural examination: Secondary | ICD-10-CM | POA: Insufficient documentation

## 2020-02-25 DIAGNOSIS — I10 Essential (primary) hypertension: Secondary | ICD-10-CM | POA: Diagnosis not present

## 2020-02-25 HISTORY — DX: Chronic kidney disease, unspecified: N18.9

## 2020-02-25 HISTORY — DX: Essential (primary) hypertension: I10

## 2020-02-25 HISTORY — DX: Type 2 diabetes mellitus without complications: E11.9

## 2020-02-25 HISTORY — DX: Family history of other specified conditions: Z84.89

## 2020-02-25 NOTE — Patient Instructions (Signed)
Your procedure is scheduled on:03-02-20 TUESDAY Report to Day Surgery on the 2nd floor of the [facility]. To find out your arrival time, please call [phone] between 1PM - 3PM on:03-01-20 MONDAY  REMEMBER: Instructions that are not followed completely may result in serious medical risk, up to and including death; or upon the discretion of your surgeon and anesthesiologist your surgery may need to be rescheduled.  Do not eat food after midnight the night before surgery.  No gum chewing, lozengers or hard candies.  You may however, drink WATER up to 2 hours before you are scheduled to arrive for your surgery. Do not drink anything within 2 hours of your scheduled arrival time.  Type 1 and Type 2 diabetics should only drink water.  In addition, your doctor has ordered for you to drink the provided   Gatorade G2 Drinking this carbohydrate drink up to two hours before surgery helps to reduce insulin resistance and improve patient outcomes. Please complete drinking 2 hours prior to scheduled arrival time.  TAKE THESE MEDICATIONS THE MORNING OF SURGERY WITH A SIP OF WATER: -GABAPENTIN (NEURONTIN) -PRILOSEC (OMEPRAZOLE)-take one the night before and one on the morning of surgery - helps to prevent nausea after surgery.   Stop JANUMET 2 days prior to surgery-LAST DOSE ON 02-28-20 SATURDAY  One week prior to surgery: Stop Anti-inflammatories (NSAIDS) such as Advil, Aleve, Ibuprofen, Motrin, Naproxen, Naprosyn and Aspirin based products such as Excedrin, Goodys Powder, BC Powder-OK TO TAKE TYLENOL IF NEEDED  Stop ANY OVER THE COUNTER supplements until after surgery.  No Alcohol for 24 hours before or after surgery.  No Smoking including e-cigarettes for 24 hours prior to surgery.  No chewable tobacco products for at least 6 hours prior to surgery.  No nicotine patches on the day of surgery.  Do not use any "recreational" drugs for at least a week prior to your surgery.  Please be  advised that the combination of cocaine and anesthesia may have negative outcomes, up to and including death. If you test positive for cocaine, your surgery will be cancelled.  On the morning of surgery brush your teeth with toothpaste and water, you may rinse your mouth with mouthwash if you wish. Do not swallow any toothpaste or mouthwash.  Do not wear jewelry, make-up, hairpins, clips or nail polish.  Do not wear lotions, powders, or perfumes.   Do not shave 48 hours prior to surgery.   Contact lenses, hearing aids and dentures may not be worn into surgery.  Do not bring valuables to the hospital. [facility] is not responsible for any missing/lost belongings or valuables.   Use CHG Soap as directed on instruction sheet.  Notify your doctor if there is any change in your medical condition (cold, fever, infection).  Wear comfortable clothing (specific to your surgery type) to the hospital.  Plan for stool softeners for home use; pain medications have a tendency to cause constipation. You can also help prevent constipation by eating foods high in fiber such as fruits and vegetables and drinking plenty of fluids as your diet allows.  After surgery, you can help prevent lung complications by doing breathing exercises.  Take deep breaths and cough every 1-2 hours. Your doctor may order a device called an Incentive Spirometer to help you take deep breaths. When coughing or sneezing, hold a pillow firmly against your incision with both hands. This is called "splinting." Doing this helps protect your incision. It also decreases belly discomfort.  If you  are being admitted to the hospital overnight, leave your suitcase in the car. After surgery it may be brought to your room.  If you are being discharged the day of surgery, you will not be allowed to drive home. You will need a responsible adult (18 years or older) to drive you home and stay with you that night.   If you are taking  public transportation, you will need to have a responsible adult (18 years or older) with you. Please confirm with your physician that it is acceptable to use public transportation.   Please call the [facility] Dept. at [phone] if you have any questions about these instructions.  Visitation Policy:  Patients undergoing a surgery or procedure may have one family member or support person with them as long as that person is not COVID-19 positive or experiencing its symptoms.  That person may remain in the waiting area during the procedure.  Inpatient Visitation Update:   In an effort to ensure the safety of our team members and our patients, we are implementing a change to our visitation policy:  Effective Monday, Aug. 9, at 7 a.m., inpatients will be allowed one support person.  o The support person may change daily.  o The support person must pass our screening, gel in and out, and wear a mask at all times, including in the patient's room.  o Patients must also wear a mask when staff or their support person are in the room.  o Masking is required regardless of vaccination status.  Systemwide, no visitors 17 or younger.

## 2020-02-25 NOTE — H&P (Signed)
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 MINUTE FRAGMENTS OF BENIGN  ENDOCERVICAL GLANDS, MUCUS, AND BLOOD.  Specimen: - [facility] Comment   Comment: Specimen A-Endocervical Curettings  DIAGNOSIS: - [facility] See below:Abnormal   Comment: Specimen A-LOW-GRADE SQUAMOUS INTRAEPITHELIAL LESION (CIN  1). MINUTE FRAGMENTS OF BENIGN ENDOCERVICAL GLANDS, MUCUS,  AND BLOOD.   Pt has a strong h/o cervical cancer with a maternal GM , 2 maternal aunts and now a sister  She desires definitive surgery  Also c/o pelvic pain on right side with pelvic exams and with sex  She is s/p 3 vaginal deliveries and 1 c/s  pelvic pain on the right has been off and on for years .   u/s today for right pelvic pain : Uterus anteflexed  Endometrium=14mm  No free fluid seen  Rt complex ovarian cyst=1.4cm; bright echogenic nodule within=0.43 x 0.32 x 0.44cm  Lt ovary appears wnl (difficult to visualize due to body habitus)    Past Medical History:has a past medical history of Anxiety, Arthritis, Depression, Diabetes mellitus without complication (CMS-HCC), GERD (gastroesophageal reflux disease), High cholesterol, Hypertension, Psoriasis, Psoriatic arthritis (CMS-HCC), RLS (restless legs syndrome), Sciatica, right side, and Vitamin D deficiency. Past Surgical History:has a past surgical history that includes Cesarean section (1992); Cholecystectomy (1992); neck surgery (2008); Biopsy Cervix (2004); teeth removed (2011); colposcopy (2004); and Tubal ligation. Family History:family history includes Arthritis in her father and mother; COPD in her father and mother; Cervical cancer in her sister; Diabetes in her father and mother; Heart disease in her father and mother;  High blood pressure (Hypertension) in her father and mother; Kidney disease in her mother; Ovarian cancer in her maternal grandmother. Social History:reports that she has been smoking cigarettes. She has never used smokeless tobacco. She reports previous alcohol use. She reports that she does not use drugs. OB/GYN History:                        OB History    Gravida  6   Para  4   Term     Preterm     AB  2   Living  4     SAB     TAB     Ectopic     Molar     Multiple     Live Births  4         Allergies:is allergic to prednisone and aspirin. Medications:  Current Outpatient Medications:  .acetaminophen (TYLENOL 8 HOUR ORAL), Take by mouth as needed, Disp: , Rfl:  .atorvastatin (LIPITOR) 20 MG tablet, Take 20 mg by mouth once daily , Disp: , Rfl:  .cetirizine HCl (ZYRTEC ORAL), Take by mouth once daily as needed, Disp: , Rfl:  .famotidine (PEPCID ORAL), Take by mouth once daily as needed Takes this about every other day., Disp: , Rfl:  .FUROsemide (LASIX) 20 MG tablet, Take by mouth, Disp: , Rfl:  .gabapentin (NEURONTIN) 300 MG capsule, Take 300 mg by mouth 3 (three) times a day, Disp: , Rfl:  .losartan (COZAAR) 25 MG tablet, , Disp: , Rfl:  .sitaGLIPtin-metFORMIN (JANUMET) 50-1,000 mg tablet, Take 1 tablet by mouth 2 (two) times daily with meals, Disp: , Rfl:   Review of Systems: General: No fatigue or weightloss Eyes:No  vision changes Ears:No hearing difficulty Respiratory:No cough or shortness of breath Pulmonary: No asthma or shortness of breath Cardiovascular:No chest pain, palpitations, dyspnea on exertion Gastrointestinal:No abdominal bloating, chronic diarrhea, constipations, masses, pain or hematochezia Genitourinary:No hematuria, dysuria, abnormal vaginal discharge,  pelvic pain, Menometrorrhagia, +pelvic pain Lymphatic:No swollen lymph nodes Musculoskeletal:No muscle weakness Neurologic:No extremity weakness, syncope, seizure disorder Psychiatric:No history of depression, delusions or suicidal/homicidal ideation   Exam:      Vitals:   02/10/20 1537  BP: 123/79  Pulse: 101    Body mass index is 49.87 kg/m.  WDWN white/ female in NAD  Lungs: CTA  CV: RRR without murmur  Neck: no thyromegaly Abdomen: soft , no mass, normal active bowel sounds, non-tender, no rebound tenderness Pelvic: tanner stage 5 ,  External genitalia: vulva /labia no lesions Urethra: no prolapse Vagina: normal physiologic d/c Cervix: no lesions, no cervical motion tenderness  Uterus: normal size shape and contour, non-tender Adnexa:no mass, + right TTP Rectovaginal:  Impression:   The primary encounter diagnosis was Pelvic pain in female. Diagnoses of Family history of cervical cancer and Cervical dysplasia were also pertinent to this visit.  echogenic 4x 3 mm nodule in ovary right .   Plan:   After a detailed discussion the pt has elected to have LAVH and bilateral salpingectomy and right oophorectomy  Benefits and risks to surgery: The proposed benefit of the surgery has been discussed with the patient. The possible risks include, but are not limited to: organ injury to the bowel , bladder, ureters, and major blood vessels and nerves. There is a possibility of additional surgeries resulting from these injuries. There is also the risk of blood transfusion and the need to receive blood products during or after the procedure which may rarely lead to HIV or Hepatitis C infection. There is a risk of developing a deep venous thrombosis or a pulmonary embolism . There is the possibility of wound infection and also anesthetic complications, even the rare possibility of death. The  patient understands these risks and wishes to proceed. All questions have been answered and the consent has been signed.      Electronically signed by Caroline Sauger, MD on 02/10/2020 4:41 PM

## 2020-02-26 ENCOUNTER — Other Ambulatory Visit: Payer: Self-pay | Admitting: Obstetrics and Gynecology

## 2020-02-27 ENCOUNTER — Other Ambulatory Visit: Payer: Self-pay

## 2020-02-27 ENCOUNTER — Other Ambulatory Visit: Payer: Medicaid Other

## 2020-02-27 ENCOUNTER — Encounter
Admission: RE | Admit: 2020-02-27 | Discharge: 2020-02-27 | Disposition: A | Discharge status: Home / Self Care | Payer: Medicaid Other | Source: Ambulatory Visit | Attending: Obstetrics and Gynecology | Admitting: Obstetrics and Gynecology

## 2020-02-27 DIAGNOSIS — Z01818 Encounter for other preprocedural examination: Secondary | ICD-10-CM | POA: Diagnosis not present

## 2020-02-27 DIAGNOSIS — E118 Type 2 diabetes mellitus with unspecified complications: Secondary | ICD-10-CM | POA: Diagnosis not present

## 2020-02-27 DIAGNOSIS — Z20822 Contact with and (suspected) exposure to covid-19: Secondary | ICD-10-CM | POA: Diagnosis not present

## 2020-02-27 DIAGNOSIS — I10 Essential (primary) hypertension: Secondary | ICD-10-CM

## 2020-02-27 LAB — CBC
HCT: 35.7 % — ABNORMAL LOW (ref 36.0–46.0)
Hemoglobin: 12.5 g/dL (ref 12.0–15.0)
MCH: 31.3 pg (ref 26.0–34.0)
MCHC: 35 g/dL (ref 30.0–36.0)
MCV: 89.3 fL (ref 80.0–100.0)
Platelets: 226 10*3/uL (ref 150–400)
RBC: 4 MIL/uL (ref 3.87–5.11)
RDW: 17.2 % — ABNORMAL HIGH (ref 11.5–15.5)
WBC: 10.3 10*3/uL (ref 4.0–10.5)
nRBC: 0 % (ref 0.0–0.2)

## 2020-02-27 LAB — BASIC METABOLIC PANEL
Anion gap: 14 (ref 5–15)
BUN: 17 mg/dL (ref 6–20)
CO2: 23 mmol/L (ref 22–32)
Calcium: 9.2 mg/dL (ref 8.9–10.3)
Chloride: 100 mmol/L (ref 98–111)
Creatinine, Ser: 1.02 mg/dL — ABNORMAL HIGH (ref 0.44–1.00)
GFR, Estimated: >60 mL/min (ref >60)
Glucose, Bld: 149 mg/dL — ABNORMAL HIGH (ref 70–99)
Potassium: 3.7 mmol/L (ref 3.5–5.1)
Sodium: 137 mmol/L (ref 135–145)

## 2020-02-27 LAB — TYPE AND SCREEN
ABO/RH(D): A POS
Antibody Screen: NEGATIVE

## 2020-02-27 LAB — SARS CORONAVIRUS 2 (TAT 6-24 HRS): SARS Coronavirus 2: NEGATIVE

## 2020-03-01 MED ORDER — GABAPENTIN 300 MG PO CAPS
300.0000 mg | ORAL_CAPSULE | ORAL | Status: AC
  Administered 2020-03-02: 300 mg via ORAL

## 2020-03-01 MED ORDER — SODIUM CHLORIDE 0.9 % IV SOLN: INTRAVENOUS | Status: DC

## 2020-03-01 MED ORDER — CHLORHEXIDINE GLUCONATE 0.12 % MT SOLN: 15.0000 mL | Freq: Once | OROMUCOSAL | Status: AC

## 2020-03-01 MED ORDER — LACTATED RINGERS IV SOLN: INTRAVENOUS | Status: DC

## 2020-03-01 MED ORDER — ACETAMINOPHEN 500 MG PO TABS: 1000.0000 mg | ORAL_TABLET | ORAL | Status: AC

## 2020-03-01 MED ORDER — DEXTROSE 5 % IV SOLN
3.0000 g | Freq: Once | INTRAVENOUS | Status: AC
  Administered 2020-03-02: 3 g via INTRAVENOUS
  Filled 2020-03-01: qty 3

## 2020-03-01 MED ORDER — ORAL CARE MOUTH RINSE: 15.0000 mL | Freq: Once | OROMUCOSAL | Status: AC

## 2020-03-01 MED ORDER — POVIDONE-IODINE 10 % EX SWAB
2.0000 "application " | Freq: Once | CUTANEOUS | Status: AC
  Administered 2020-03-02: 2 via TOPICAL

## 2020-03-02 ENCOUNTER — Ambulatory Visit
Admission: RE | Admit: 2020-03-02 | Discharge: 2020-03-02 | Disposition: A | Discharge status: Home / Self Care | Payer: Medicaid Other | Attending: Obstetrics and Gynecology | Admitting: Obstetrics and Gynecology

## 2020-03-02 ENCOUNTER — Ambulatory Visit: Payer: Medicaid Other | Admitting: Urgent Care

## 2020-03-02 ENCOUNTER — Other Ambulatory Visit: Payer: Self-pay

## 2020-03-02 ENCOUNTER — Encounter: Payer: Self-pay | Admitting: Obstetrics and Gynecology

## 2020-03-02 ENCOUNTER — Encounter
Admission: RE | Disposition: A | Discharge status: Home / Self Care | Payer: Self-pay | Source: Home / Self Care | Attending: Obstetrics and Gynecology

## 2020-03-02 DIAGNOSIS — N870 Mild cervical dysplasia: Secondary | ICD-10-CM | POA: Insufficient documentation

## 2020-03-02 DIAGNOSIS — N879 Dysplasia of cervix uteri, unspecified: Secondary | ICD-10-CM | POA: Diagnosis present

## 2020-03-02 DIAGNOSIS — N72 Inflammatory disease of cervix uteri: Secondary | ICD-10-CM | POA: Diagnosis not present

## 2020-03-02 DIAGNOSIS — Z8049 Family history of malignant neoplasm of other genital organs: Secondary | ICD-10-CM | POA: Insufficient documentation

## 2020-03-02 DIAGNOSIS — D270 Benign neoplasm of right ovary: Secondary | ICD-10-CM | POA: Insufficient documentation

## 2020-03-02 DIAGNOSIS — N838 Other noninflammatory disorders of ovary, fallopian tube and broad ligament: Secondary | ICD-10-CM | POA: Insufficient documentation

## 2020-03-02 DIAGNOSIS — N941 Unspecified dyspareunia: Secondary | ICD-10-CM | POA: Diagnosis present

## 2020-03-02 DIAGNOSIS — F17210 Nicotine dependence, cigarettes, uncomplicated: Secondary | ICD-10-CM | POA: Diagnosis not present

## 2020-03-02 HISTORY — PX: LAPAROSCOPIC VAGINAL HYSTERECTOMY WITH SALPINGO OOPHORECTOMY: SHX6681

## 2020-03-02 LAB — GLUCOSE, CAPILLARY
Glucose-Capillary: 163 mg/dL — ABNORMAL HIGH (ref 70–99)
Glucose-Capillary: 197 mg/dL — ABNORMAL HIGH (ref 70–99)

## 2020-03-02 LAB — ABO/RH: ABO/RH(D): A POS

## 2020-03-02 LAB — POCT PREGNANCY, URINE: Preg Test, Ur: NEGATIVE

## 2020-03-02 SURGERY — HYSTERECTOMY, VAGINAL, LAPAROSCOPY-ASSISTED, WITH SALPINGO-OOPHORECTOMY
Anesthesia: General | Laterality: Bilateral

## 2020-03-02 MED ORDER — KETOROLAC TROMETHAMINE 30 MG/ML IJ SOLN
INTRAMUSCULAR | Status: DC | PRN
  Administered 2020-03-02: 30 mg via INTRAVENOUS

## 2020-03-02 MED ORDER — MEPERIDINE HCL 50 MG/ML IJ SOLN: 6.2500 mg | INTRAMUSCULAR | Status: DC | PRN

## 2020-03-02 MED ORDER — ACETAMINOPHEN 500 MG PO TABS
ORAL_TABLET | ORAL | Status: AC
  Administered 2020-03-02: 1000 mg via ORAL
  Filled 2020-03-02: qty 2

## 2020-03-02 MED ORDER — SUGAMMADEX SODIUM 500 MG/5ML IV SOLN
INTRAVENOUS | Status: DC | PRN
  Administered 2020-03-02: 300 mg via INTRAVENOUS

## 2020-03-02 MED ORDER — LIDOCAINE-EPINEPHRINE 1 %-1:100000 IJ SOLN
INTRAMUSCULAR | Status: AC
  Filled 2020-03-02: qty 1

## 2020-03-02 MED ORDER — MIDAZOLAM HCL 2 MG/2ML IJ SOLN
INTRAMUSCULAR | Status: DC | PRN
  Administered 2020-03-02: 2 mg via INTRAVENOUS

## 2020-03-02 MED ORDER — LIDOCAINE HCL (PF) 2 % IJ SOLN
INTRAMUSCULAR | Status: AC
  Filled 2020-03-02: qty 5

## 2020-03-02 MED ORDER — SUGAMMADEX SODIUM 500 MG/5ML IV SOLN
INTRAVENOUS | Status: AC
  Filled 2020-03-02: qty 5

## 2020-03-02 MED ORDER — MIDAZOLAM HCL 2 MG/2ML IJ SOLN
INTRAMUSCULAR | Status: AC
  Filled 2020-03-02: qty 2

## 2020-03-02 MED ORDER — HEMOSTATIC AGENTS (NO CHARGE) OPTIME
TOPICAL | Status: DC | PRN
  Administered 2020-03-02: 1 via TOPICAL

## 2020-03-02 MED ORDER — OXYCODONE HCL 5 MG PO TABS
5.0000 mg | ORAL_TABLET | Freq: Once | ORAL | Status: AC | PRN
  Administered 2020-03-02: 5 mg via ORAL

## 2020-03-02 MED ORDER — SUCCINYLCHOLINE CHLORIDE 200 MG/10ML IV SOSY
PREFILLED_SYRINGE | INTRAVENOUS | Status: AC
  Filled 2020-03-02: qty 10

## 2020-03-02 MED ORDER — LIDOCAINE HCL (CARDIAC) PF 100 MG/5ML IV SOSY
PREFILLED_SYRINGE | INTRAVENOUS | Status: DC | PRN
  Administered 2020-03-02: 100 mg via INTRAVENOUS

## 2020-03-02 MED ORDER — LIDOCAINE-EPINEPHRINE 1 %-1:100000 IJ SOLN
INTRAMUSCULAR | Status: DC | PRN
  Administered 2020-03-02: 6 mL
  Administered 2020-03-02: 8 mL

## 2020-03-02 MED ORDER — SUCCINYLCHOLINE CHLORIDE 20 MG/ML IJ SOLN
INTRAMUSCULAR | Status: DC | PRN
  Administered 2020-03-02: 180 mg via INTRAVENOUS

## 2020-03-02 MED ORDER — CHLORHEXIDINE GLUCONATE 0.12 % MT SOLN
OROMUCOSAL | Status: AC
  Administered 2020-03-02: 15 mL via OROMUCOSAL
  Filled 2020-03-02: qty 15

## 2020-03-02 MED ORDER — DEXAMETHASONE SODIUM PHOSPHATE 10 MG/ML IJ SOLN
INTRAMUSCULAR | Status: AC
  Filled 2020-03-02: qty 1

## 2020-03-02 MED ORDER — SODIUM CHLORIDE 0.9 % IV SOLN
INTRAVENOUS | Status: DC | PRN
  Administered 2020-03-02: 50 ug/min via INTRAVENOUS

## 2020-03-02 MED ORDER — ROCURONIUM BROMIDE 10 MG/ML (PF) SYRINGE
PREFILLED_SYRINGE | INTRAVENOUS | Status: AC
  Filled 2020-03-02: qty 10

## 2020-03-02 MED ORDER — ROCURONIUM BROMIDE 100 MG/10ML IV SOLN
INTRAVENOUS | Status: DC | PRN
  Administered 2020-03-02: 10 mg via INTRAVENOUS
  Administered 2020-03-02: 20 mg via INTRAVENOUS
  Administered 2020-03-02 (×2): 10 mg via INTRAVENOUS
  Administered 2020-03-02: 20 mg via INTRAVENOUS
  Administered 2020-03-02: 50 mg via INTRAVENOUS

## 2020-03-02 MED ORDER — PROMETHAZINE HCL 25 MG/ML IJ SOLN: 6.2500 mg | INTRAMUSCULAR | Status: DC | PRN

## 2020-03-02 MED ORDER — VASOPRESSIN 20 UNIT/ML IV SOLN
INTRAVENOUS | Status: DC | PRN
  Administered 2020-03-02: 4 [IU] via INTRAVENOUS
  Administered 2020-03-02 (×2): 2 [IU] via INTRAVENOUS

## 2020-03-02 MED ORDER — FENTANYL CITRATE (PF) 100 MCG/2ML IJ SOLN
INTRAMUSCULAR | Status: AC
  Administered 2020-03-02: 25 ug via INTRAVENOUS
  Filled 2020-03-02: qty 2

## 2020-03-02 MED ORDER — BUPIVACAINE HCL 0.5 % IJ SOLN
INTRAMUSCULAR | Status: DC | PRN
  Administered 2020-03-02: 11 mL

## 2020-03-02 MED ORDER — PROPOFOL 10 MG/ML IV BOLUS
INTRAVENOUS | Status: DC | PRN
  Administered 2020-03-02: 200 mg via INTRAVENOUS

## 2020-03-02 MED ORDER — FENTANYL CITRATE (PF) 100 MCG/2ML IJ SOLN
INTRAMUSCULAR | Status: AC
  Filled 2020-03-02: qty 2

## 2020-03-02 MED ORDER — PROPOFOL 10 MG/ML IV BOLUS
INTRAVENOUS | Status: AC
  Filled 2020-03-02: qty 20

## 2020-03-02 MED ORDER — ONDANSETRON HCL 4 MG/2ML IJ SOLN
INTRAMUSCULAR | Status: AC
  Filled 2020-03-02: qty 2

## 2020-03-02 MED ORDER — ONDANSETRON HCL 4 MG/2ML IJ SOLN
INTRAMUSCULAR | Status: DC | PRN
  Administered 2020-03-02: 4 mg via INTRAVENOUS

## 2020-03-02 MED ORDER — OXYCODONE HCL 5 MG PO TABS
ORAL_TABLET | ORAL | Status: AC
  Filled 2020-03-02: qty 1

## 2020-03-02 MED ORDER — FENTANYL CITRATE (PF) 100 MCG/2ML IJ SOLN
INTRAMUSCULAR | Status: DC | PRN
  Administered 2020-03-02 (×3): 50 ug via INTRAVENOUS

## 2020-03-02 MED ORDER — FENTANYL CITRATE (PF) 100 MCG/2ML IJ SOLN
25.0000 ug | INTRAMUSCULAR | Status: DC | PRN
  Administered 2020-03-02: 50 ug via INTRAVENOUS
  Administered 2020-03-02: 25 ug via INTRAVENOUS

## 2020-03-02 MED ORDER — EPHEDRINE SULFATE 50 MG/ML IJ SOLN
INTRAMUSCULAR | Status: DC | PRN
  Administered 2020-03-02: 10 mg via INTRAVENOUS

## 2020-03-02 MED ORDER — PHENYLEPHRINE HCL (PRESSORS) 10 MG/ML IV SOLN
INTRAVENOUS | Status: AC
  Filled 2020-03-02: qty 1

## 2020-03-02 MED ORDER — OXYCODONE HCL 5 MG/5ML PO SOLN: 5.0000 mg | Freq: Once | ORAL | Status: AC | PRN

## 2020-03-02 MED ORDER — BUPIVACAINE HCL (PF) 0.5 % IJ SOLN
INTRAMUSCULAR | Status: AC
  Filled 2020-03-02: qty 30

## 2020-03-02 MED ORDER — GLYCOPYRROLATE 0.2 MG/ML IJ SOLN
INTRAMUSCULAR | Status: AC
  Filled 2020-03-02: qty 1

## 2020-03-02 MED ORDER — PHENYLEPHRINE HCL (PRESSORS) 10 MG/ML IV SOLN
INTRAVENOUS | Status: DC | PRN
  Administered 2020-03-02: 150 ug via INTRAVENOUS
  Administered 2020-03-02 (×2): 100 ug via INTRAVENOUS
  Administered 2020-03-02: 200 ug via INTRAVENOUS
  Administered 2020-03-02: 100 ug via INTRAVENOUS
  Administered 2020-03-02 (×2): 200 ug via INTRAVENOUS
  Administered 2020-03-02: 100 ug via INTRAVENOUS
  Administered 2020-03-02: 200 ug via INTRAVENOUS

## 2020-03-02 SURGICAL SUPPLY — 58 items
APL PRP STRL LF DISP 70% ISPRP (MISCELLANEOUS) ×1
APL SRG 38 LTWT LNG FL B (MISCELLANEOUS) ×1
APPLICATOR ARISTA FLEXITIP XL (MISCELLANEOUS) ×3 IMPLANT
BAG DRN RND TRDRP ANRFLXCHMBR (UROLOGICAL SUPPLIES) ×1
BAG URINE DRAIN 2000ML AR STRL (UROLOGICAL SUPPLIES) ×3 IMPLANT
BLADE SURG SZ11 CARB STEEL (BLADE) ×3 IMPLANT
CATH FOLEY 2WAY  5CC 16FR (CATHETERS) ×2
CATH FOLEY 2WAY 5CC 16FR (CATHETERS) ×1
CATH ROBINSON RED A/P 16FR (CATHETERS) IMPLANT
CATH URTH 16FR FL 2W BLN LF (CATHETERS) ×1 IMPLANT
CHLORAPREP W/TINT 26 (MISCELLANEOUS) ×3 IMPLANT
CLOSURE WOUND 1/2 X4 (GAUZE/BANDAGES/DRESSINGS) ×1
COVER WAND RF STERILE (DRAPES) ×3 IMPLANT
DEFOGGER SCOPE WARMER CLEARIFY (MISCELLANEOUS) ×3 IMPLANT
DRAPE SURG 17X11 SM STRL (DRAPES) ×3 IMPLANT
DRSG TEGADERM 2-3/8X2-3/4 SM (GAUZE/BANDAGES/DRESSINGS) ×12 IMPLANT
ELECT BLADE 6.5 EXT (BLADE) ×3 IMPLANT
ELECT REM PT RETURN 9FT ADLT (ELECTROSURGICAL) ×3
ELECTRODE REM PT RTRN 9FT ADLT (ELECTROSURGICAL) ×1 IMPLANT
FILTER LAP SMOKE EVAC STRL (MISCELLANEOUS) IMPLANT
GAUZE 4X4 16PLY RFD (DISPOSABLE) ×3 IMPLANT
GLOVE SURG SYN 8.0 (GLOVE) ×30 IMPLANT
GOWN STRL REUS W/ TWL LRG LVL3 (GOWN DISPOSABLE) ×3 IMPLANT
GOWN STRL REUS W/ TWL XL LVL3 (GOWN DISPOSABLE) ×2 IMPLANT
GOWN STRL REUS W/TWL LRG LVL3 (GOWN DISPOSABLE) ×9
GOWN STRL REUS W/TWL XL LVL3 (GOWN DISPOSABLE) ×6
GRASPER SUT TROCAR 14GX15 (MISCELLANEOUS) IMPLANT
HEMOSTAT ARISTA ABSORB 3G PWDR (HEMOSTASIS) ×3 IMPLANT
IRRIGATION STRYKERFLOW (MISCELLANEOUS) ×1 IMPLANT
IRRIGATOR STRYKERFLOW (MISCELLANEOUS) ×3
IV LACTATED RINGERS 1000ML (IV SOLUTION) ×3 IMPLANT
KIT PINK PAD W/HEAD ARE REST (MISCELLANEOUS) ×3
KIT PINK PAD W/HEAD ARM REST (MISCELLANEOUS) ×1 IMPLANT
KIT TURNOVER CYSTO (KITS) ×3 IMPLANT
LABEL OR SOLS (LABEL) ×3 IMPLANT
NEEDLE HYPO 22GX1.5 SAFETY (NEEDLE) ×3 IMPLANT
PACK BASIN MINOR (MISCELLANEOUS) ×3 IMPLANT
PACK GYN LAPAROSCOPIC (MISCELLANEOUS) ×3 IMPLANT
PAD OB MATERNITY 4.3X12.25 (PERSONAL CARE ITEMS) ×3 IMPLANT
SHEARS HARMONIC ACE PLUS 36CM (ENDOMECHANICALS) ×3 IMPLANT
SLEEVE ENDOPATH XCEL 5M (ENDOMECHANICALS) ×6 IMPLANT
SPONGE GAUZE 2X2 8PLY STER LF (GAUZE/BANDAGES/DRESSINGS) ×3
SPONGE GAUZE 2X2 8PLY STRL LF (GAUZE/BANDAGES/DRESSINGS) ×6 IMPLANT
STRIP CLOSURE SKIN 1/2X4 (GAUZE/BANDAGES/DRESSINGS) ×2 IMPLANT
SUT PDS 2-0 27IN (SUTURE) ×3 IMPLANT
SUT VIC AB 0 CT1 27 (SUTURE) ×6
SUT VIC AB 0 CT1 27XCR 8 STRN (SUTURE) ×2 IMPLANT
SUT VIC AB 0 CT1 36 (SUTURE) ×6 IMPLANT
SUT VIC AB 0 CT2 27 (SUTURE) ×3 IMPLANT
SUT VIC AB 2-0 SH 27 (SUTURE) ×3
SUT VIC AB 2-0 SH 27XBRD (SUTURE) ×1 IMPLANT
SUT VIC AB 2-0 UR6 27 (SUTURE) IMPLANT
SUT VIC AB 4-0 SH 27 (SUTURE) ×3
SUT VIC AB 4-0 SH 27XANBCTRL (SUTURE) ×1 IMPLANT
SYR 10ML LL (SYRINGE) ×3 IMPLANT
SYR CONTROL 10ML LL (SYRINGE) ×3 IMPLANT
TROCAR XCEL NON-BLD 5MMX100MML (ENDOMECHANICALS) ×3 IMPLANT
TUBING EVAC SMOKE HEATED PNEUM (TUBING) ×3 IMPLANT

## 2020-03-02 NOTE — Anesthesia Postprocedure Evaluation (Signed)
Anesthesia Post Note  Patient: Cheryl Sullivan  Procedure(s) Performed: LAPAROSCOPIC ASSISTED VAGINAL HYSTERECTOMY WITH BILATERAL SALPINECTOMY, RIGHT OOPHORECTOMY (Bilateral )  Patient location during evaluation: PACU Anesthesia Type: General Level of consciousness: awake and alert and oriented Pain management: pain level controlled Vital Signs Assessment: post-procedure vital signs reviewed and stable Respiratory status: spontaneous breathing, nonlabored ventilation and respiratory function stable Cardiovascular status: blood pressure returned to baseline and stable Postop Assessment: no signs of nausea or vomiting Anesthetic complications: no   No complications documented.   Last Vitals:  Vitals:   03/02/20 1102 03/02/20 1117  BP: 105/67 110/72  Pulse: 87 88  Resp: 14 (!) 22  Temp:    SpO2: 99% 94%    Last Pain:  Vitals:   03/02/20 1117  TempSrc:   PainSc: 8                  Dajae Kizer

## 2020-03-02 NOTE — Progress Notes (Signed)
Here for LAVH , RSO and left salpingectomy . All questions answered . Labs reviewed . Proceed

## 2020-03-02 NOTE — Op Note (Signed)
NAME: Cheryl Sullivan, Cheryl Sullivan MEDICAL RECORD [id number] ACCOUNT [id number] DATE OF BIRTH:[birth year] FACILITY: [facility] LOCATION: [place], [place]  OPERATIVE REPORT  DATE OF PROCEDURE:  03/02/2020  PREOPERATIVE DIAGNOSES: 1.  Cervical dysplasia. 2.  Right pelvic pain. 3.  Dyspareunia.  POSTOPERATIVE DIAGNOSES: 1.  Cervical dysplasia. 2.  Right pelvic pain. 3.  Dyspareunia.  PROCEDURE: 1.  Laparoscopic-assisted vaginal hysterectomy. 2.  Right salpingo-oophorectomy. 3.  Left salpingectomy.  ANESTHESIA:  General endotracheal anesthesia.  SURGEON:  Laverta Baltimore MD.  FIRST ASSISTANT:  Chelsea Ward, MD.  INDICATIONS:  A 49 year old gravida 25, para 4 patient with a history of cervical dysplasia and a strong family history with multiple family members with cervical cancer.  The patient has elected for definitive surgery.  The patient also complains of  right pelvic pain, pain with speculum exams and pain with intercourse to the right side.  DESCRIPTION OF PROCEDURE:  After adequate general endotracheal  anesthesia, the patient was placed in dorsal supine position with the legs in the Hull stirrups.  The patient's abdomen, perineum and vagina were prepped and draped in normal sterile  fashion.  She did receive 3 grams IV Ancef for surgical prophylaxis.  Timeout was performed.  Speculum was placed into the vagina and the anterior cervix was grasped with a single tooth tenaculum.  Foley catheter was placed in the bladder, yielded 50 mL  of extremely concentrated dark urine.  Gloves and gown were changed.  Attention was directed to the patient's abdomen where a 5 mm infraumbilical incision was made after injecting with 0.5% Marcaine, the 5 mm hysteroscope was advanced into the abdominal  cavity under direct visualization with the Optiview cannula.  The patient's abdomen was insufflated with carbon dioxide.  Second port site was placed in the left lower  quadrant, again 3 cm medial to the left anterior iliac spine and under direct  visualization, 5 mm trocar was advanced into the abdominal cavity.  Likewise on the right side, a third port was placed 3 cm medial to the right anterior iliac spine and a 5 mm trocar was advanced under direct visualization.  The patient was placed in  Trendelenburg.  There were no significant adhesions noted.  The patient's left fallopian tube was grasped at the fimbriated end and the mesosalpinx was clamped, cauterized and transected to the level of the cornua.  The left broad ligament was opened as  well as the round ligament and the left uterine artery was skeletonized, cauterized with the Kleppinger cautery and transected with Harmonic scalpel.  Anterior bladder flap was opened.  Attention was directed to the patient's right side.  Again, given  that her pain was on the right, the right ovary was to be removed.  The right infundibulopelvic ligament was cauterized and transected.  The broad ligament was opened on the right and likewise the right uterine artery was skeletonized, cauterized and  transected.  Anterior cul-de-sac was again developed and the bladder was reflected off the lower uterine segment.  At this point, attention was directed vaginally.  Cervix was noted to be very high in the vault with small stromal tissue.  Two thyroid  tenaculas were used to clamp the anterior and posterior on the cervix.  Cervix was circumferentially injected with 1% lidocaine with 1:100,000 epinephrine.  A direct posterior colpotomy incision was made.  Upon entry into the posterior cul-de-sac, long  billed weighted speculum was placed.  Uterosacral ligaments were then bilaterally clamped, transected, suture ligated with 0 Vicryl suture.  Anterior cervix was incised with the Bovie.  The cardinal ligaments were bilaterally clamped, transected, suture  ligated with 0 Vicryl suture.  Anterior cul-de-sac was entered sharply.  Two additional  clamps, clamping and transecting on each side allowed for the delivery of the cervix, the uterus, the fallopian tubes and the right ovary.  The vaginal cuff appeared  hemostatic and then the cuff was closed with a running 0 Vicryl suture, and the uterosacral ligaments were plicated centrally and the rest of the vault was closed.  Catheterization of the bladder yielded additional 20 mL concentrated urine.  Gloves and  gown were changed and attention was directed to the patient's abdomen.  Again, after inflating the patient's abdomen, the vaginal cuff appeared hemostatic.  Ureters could not be identified prior to the case or at the end of the case, given her body  habitus.  Arista was then placed in the posterior cul-de-sac where there was a small peritoneal defect and pressure was lowered to 7 mmHg and good hemostasis noted.  Pedicles appeared hemostatic.  The patient's abdomen was deflated and all instruments  were removed.  The 3 incisions were closed with a 4-0 Vicryl suture.  Sterile dressing applied.  COMPLICATIONS:  There were no complications.  ESTIMATED BLOOD LOSS:  180 mL.  INTRAOPERATIVE FLUIDS:  1700 mL.  URINE OUTPUT:  70 mL.  DISPOSITION:  The patient was taken to recovery room in good condition.  HN/NUANCE  D:03/02/2020 T:03/02/2020 JOB:[id number]

## 2020-03-02 NOTE — Discharge Instructions (Signed)

## 2020-03-02 NOTE — Anesthesia Preprocedure Evaluation (Signed)
Anesthesia Evaluation  Patient identified by MRN, date of birth, ID band Patient awake    Reviewed: Allergy & Precautions, NPO status , Patient's Chart, lab work & pertinent test results  History of Anesthesia Complications Negative for: history of anesthetic complications  Airway Mallampati: III  TM Distance: >3 FB Neck ROM: Full    Dental  (+) Edentulous Upper, Edentulous Lower   Pulmonary neg sleep apnea, neg COPD, Current Smoker and Patient abstained from smoking.,    breath sounds clear to auscultation- rhonchi (-) wheezing      Cardiovascular hypertension, Pt. on medications (-) CAD, (-) Past MI, (-) Cardiac Stents and (-) CABG  Rhythm:Regular Rate:Normal - Systolic murmurs and - Diastolic murmurs    Neuro/Psych neg Seizures negative neurological ROS  negative psych ROS   GI/Hepatic negative GI ROS, Neg liver ROS,   Endo/Other  diabetes, Oral Hypoglycemic Agents  Renal/GU Renal InsufficiencyRenal disease     Musculoskeletal  (+) Arthritis ,   Abdominal (+) + obese,   Peds  Hematology negative hematology ROS (+)   Anesthesia Other Findings Past Medical History: No date: Arthritis No date: Chronic kidney disease No date: Diabetes mellitus without complication (HCC) No date: Family history of adverse reaction to anesthesia     Comment:  mom-n/v No date: Hypertension No date: Neuropathy   Reproductive/Obstetrics                             Anesthesia Physical Anesthesia Plan  ASA: III  Anesthesia Plan: General   Post-op Pain Management:    Induction: Intravenous  PONV Risk Score and Plan: 1 and Ondansetron and Midazolam  Airway Management Planned: Oral ETT  Additional Equipment:   Intra-op Plan:   Post-operative Plan: Extubation in OR  Informed Consent: I have reviewed the patients History and Physical, chart, labs and discussed the procedure including the risks,  benefits and alternatives for the proposed anesthesia with the patient or authorized representative who has indicated his/her understanding and acceptance.     Dental advisory given  Plan Discussed with: CRNA and Anesthesiologist  Anesthesia Plan Comments:         Anesthesia Quick Evaluation

## 2020-03-02 NOTE — Transfer of Care (Signed)
Immediate Anesthesia Transfer of Care Note  Patient: Cheryl Sullivan  Procedure(s) Performed: LAPAROSCOPIC ASSISTED VAGINAL HYSTERECTOMY WITH BILATERAL SALPINECTOMY, RIGHT OOPHORECTOMY (Bilateral )  Patient Location: PACU  Anesthesia Type:General  Level of Consciousness: awake and alert   Airway & Oxygen Therapy: Patient Spontanous Breathing and Patient connected to face mask oxygen  Post-op Assessment: Report given to RN and Post -op Vital signs reviewed and stable  Post vital signs: Reviewed and stable  Last Vitals:  Vitals Value Taken Time  BP    Temp    Pulse 87 03/02/20 1037  Resp 11 03/02/20 1037  SpO2 100 % 03/02/20 1037  Vitals shown include unvalidated device data.  Last Pain:  Vitals:   03/02/20 1032  TempSrc:   PainSc: Asleep         Complications: No complications documented.

## 2020-03-02 NOTE — Brief Op Note (Signed)
03/02/2020  10:11 AM  PATIENT:  Cheryl Sullivan  49 y.o. female  PRE-OPERATIVE DIAGNOSIS:  right pelvic pain, cervical dysplasia  POST-OPERATIVE DIAGNOSIS:  right pelvic pain, cervical dysplasia  PROCEDURE:  Procedure(s): LAPAROSCOPIC ASSISTED VAGINAL HYSTERECTOMY WITH BILATERAL SALPINECTOMY, RIGHT OOPHORECTOMY (Bilateral)  SURGEON:  Surgeon(s) and Role:    * Kristoph Sattler, Gwen Her, MD - Primary    * Ward, Honor Loh, MD - Assisting  PHYSICIAN ASSISTANT:   ASSISTANTS: cst   ANESTHESIA:   general  EBL:  180 mL UO 70 cc IOF 1700  BLOOD ADMINISTERED:none  DRAINS: none   LOCAL MEDICATIONS USED:  MARCAINE    and LIDOCAINE   SPECIMEN:  Source of Specimen:  cx , uterus  bilateral fallopian tubes and right ovary   DISPOSITION OF SPECIMEN:  PATHOLOGY  COUNTS:  YES  TOURNIQUET:  * No tourniquets in log *  DICTATION: .Other Dictation: Dictation Number verbal  PLAN OF CARE: Discharge to home after PACU  PATIENT DISPOSITION:  PACU - hemodynamically stable.   Delay start of Pharmacological VTE agent (>24hrs) due to surgical blood loss or risk of bleeding: not applicable

## 2020-03-02 NOTE — Anesthesia Procedure Notes (Signed)
Procedure Name: Intubation Date/Time: 03/02/2020 7:50 AM Performed by: Johnna Acosta, CRNA Pre-anesthesia Checklist: Patient identified, Emergency Drugs available, Suction available, Patient being monitored and Timeout performed Patient Re-evaluated:Patient Re-evaluated prior to induction Oxygen Delivery Method: Circle system utilized Preoxygenation: Pre-oxygenation with 100% oxygen Induction Type: IV induction, Rapid sequence and Cricoid Pressure applied Laryngoscope Size: McGraph and 3 Grade View: Grade I Tube type: Oral Tube size: 7.5 mm Number of attempts: 1 Airway Equipment and Method: Stylet,  Video-laryngoscopy and Oral airway Placement Confirmation: ETT inserted through vocal cords under direct vision,  positive ETCO2 and breath sounds checked- equal and bilateral Secured at: 21 cm Tube secured with: Tape Dental Injury: Teeth and Oropharynx as per pre-operative assessment  Difficulty Due To: Difficulty was anticipated, Difficult Airway- due to large tongue and Difficult Airway- due to limited oral opening

## 2020-03-03 ENCOUNTER — Other Ambulatory Visit: Payer: Medicaid Other

## 2020-03-03 ENCOUNTER — Encounter: Payer: Self-pay | Admitting: Obstetrics and Gynecology

## 2020-03-05 LAB — SURGICAL PATHOLOGY

## 2023-06-06 ENCOUNTER — Other Ambulatory Visit: Payer: Self-pay | Admitting: Family Medicine

## 2023-06-06 DIAGNOSIS — Z1231 Encounter for screening mammogram for malignant neoplasm of breast: Secondary | ICD-10-CM

## 2023-06-15 ENCOUNTER — Ambulatory Visit
Admission: RE | Admit: 2023-06-15 | Discharge: 2023-06-15 | Disposition: A | Discharge status: Home / Self Care | Payer: BC Managed Care – PPO | Source: Ambulatory Visit | Attending: Family Medicine | Admitting: Family Medicine

## 2023-06-15 DIAGNOSIS — Z1231 Encounter for screening mammogram for malignant neoplasm of breast: Secondary | ICD-10-CM | POA: Diagnosis present

## 2023-06-18 ENCOUNTER — Other Ambulatory Visit: Payer: Self-pay | Admitting: *Deleted

## 2023-06-18 ENCOUNTER — Inpatient Hospital Stay
Admission: RE | Admit: 2023-06-18 | Discharge: 2023-06-18 | Disposition: A | Discharge status: Home / Self Care | Payer: Self-pay | Source: Ambulatory Visit | Attending: Family Medicine | Admitting: Family Medicine

## 2023-06-18 DIAGNOSIS — Z1231 Encounter for screening mammogram for malignant neoplasm of breast: Secondary | ICD-10-CM

## 2023-07-22 ENCOUNTER — Emergency Department

## 2023-07-22 ENCOUNTER — Emergency Department
Admission: EM | Admit: 2023-07-22 | Discharge: 2023-07-22 | Disposition: A | Discharge status: Home / Self Care | Attending: Emergency Medicine | Admitting: Emergency Medicine

## 2023-07-22 DIAGNOSIS — N189 Chronic kidney disease, unspecified: Secondary | ICD-10-CM | POA: Diagnosis not present

## 2023-07-22 DIAGNOSIS — L4050 Arthropathic psoriasis, unspecified: Secondary | ICD-10-CM | POA: Insufficient documentation

## 2023-07-22 DIAGNOSIS — E1140 Type 2 diabetes mellitus with diabetic neuropathy, unspecified: Secondary | ICD-10-CM | POA: Diagnosis not present

## 2023-07-22 DIAGNOSIS — E1122 Type 2 diabetes mellitus with diabetic chronic kidney disease: Secondary | ICD-10-CM | POA: Insufficient documentation

## 2023-07-22 DIAGNOSIS — I129 Hypertensive chronic kidney disease with stage 1 through stage 4 chronic kidney disease, or unspecified chronic kidney disease: Secondary | ICD-10-CM | POA: Insufficient documentation

## 2023-07-22 DIAGNOSIS — M79642 Pain in left hand: Secondary | ICD-10-CM | POA: Diagnosis present

## 2023-07-22 MED ORDER — HYDROCODONE-ACETAMINOPHEN 5-325 MG PO TABS: 1.0000 | ORAL_TABLET | ORAL | 0 refills | Status: AC | PRN

## 2023-07-22 NOTE — ED Triage Notes (Signed)
 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 Pt reports that the pain has been there, but has increased the last week and a half. Strong radial pulse. Good cap refill. Pt has a hx of neuropathy as well. Pt reports shoot pain in her hand and numbness in two of her fingers. States that the numbness in her fingers started about 1.5 weeks ago.

## 2023-07-22 NOTE — Discharge Instructions (Addendum)
 Please follow-up with rheumatology as soon as you are able.  Please take your pain medication as needed but only as prescribed.  Do not drink alcohol or drive while taking this medication.

## 2023-07-22 NOTE — ED Provider Notes (Signed)
 [facility] Provider Note    Event Date/Time   First MD Initiated Contact with Patient 07/22/23 1524     (approximate)  History   Chief Complaint: left hand pain  HPI  Cheryl Sullivan is a 53 y.o. female with a past medical history of psoriatic arthritis, CKD, diabetes, hypertension, neuropathy, presents to the emergency department for worsening left hand pain.  According to the patient for the past 6 months or more she has been experiencing increased pain in the left hand and wrist.  Patient has a history of psoriasis as well as psoriatic arthritis, she was on biologic medications until she lost her insurance a year or so ago and she could not afford the medication.  She states since that time she has had worsening pain in her joints but specifically in her left wrist and hand she has had increased pain.  Patient has since gotten health insurance once again and has made an appointment with both her doctor as well as rheumatologist which is next month.  Patient states the pain has become significant so she came to the emergency department hoping for pain relief.  Patient states she is allergic to steroids and has not tolerated prednisone in the past.  Physical Exam   Triage Vital Signs: ED Triage Vitals [07/22/23 1453]  Encounter Vitals Group     BP (!) 144/83     Systolic BP Percentile      Diastolic BP Percentile      Pulse Rate 87     Resp 18     Temp 98.4 F (36.9 C)     Temp Source Oral     SpO2 95 %     Weight 289 lb (131.1 kg)     Height 5\' 8"  (1.727 m)     Head Circumference      Peak Flow      Pain Score 9     Pain Loc      Pain Education      Exclude from Growth Chart     Most recent vital signs: Vitals:   07/22/23 1453  BP: (!) 144/83  Pulse: 87  Resp: 18  Temp: 98.4 F (36.9 C)  SpO2: 95%    General: Awake, no distress.  CV:  Good peripheral perfusion Resp:  Normal effort.  Abd:  No distention.   Other:  Patient has normal  cap refill in all digits of the left hand, warm to the touch sensation intact.  Patient does state pain in the wrist especially with range of motion.   ED Results / Procedures / Treatments   RADIOLOGY  I reviewed the x-ray images.  No obvious fracture or significant abnormality on my evaluation.  MEDICATIONS ORDERED IN ED: Medications - No data to display  IMPRESSION / MDM / ASSESSMENT AND PLAN / ED COURSE  I reviewed the triage vital signs and the nursing notes.  Patient's presentation is most consistent with acute illness / injury with system symptoms.  Patient presents to the emergency department for left hand and wrist pain.  Patient has tenderness to this area has known psoriatic arthritis in this area per patient.  X-ray does not appear to show any significant abnormality.  I discussed with the patient a trial of steroids however the patient states she is allergic to steroids has not been able to tolerate steroids or prednisone in the past.  Patient has an appointment coming up next month with rheumatology.  We will  hold off on any long-term treatment until the patient can see her rheumatologist.  Will prescribe a short course of Norco for the patient to take until she is able to see her doctor.  Patient agreeable to plan of care.  FINAL CLINICAL IMPRESSION(S) / ED DIAGNOSES   Psoriatic arthritis  Rx / DC Orders   Norco  Note:  This document was prepared using Dragon voice recognition software and may include unintentional dictation errors.   Minna Antis, MD 07/22/23 [phone]

## 2024-03-20 ENCOUNTER — Other Ambulatory Visit: Payer: Self-pay | Admitting: Physician Assistant

## 2024-03-20 DIAGNOSIS — G8929 Other chronic pain: Secondary | ICD-10-CM

## 2024-03-20 DIAGNOSIS — R200 Anesthesia of skin: Secondary | ICD-10-CM

## 2024-03-20 DIAGNOSIS — R262 Difficulty in walking, not elsewhere classified: Secondary | ICD-10-CM

## 2024-03-20 DIAGNOSIS — R29898 Other symptoms and signs involving the musculoskeletal system: Secondary | ICD-10-CM

## 2024-03-20 DIAGNOSIS — G629 Polyneuropathy, unspecified: Secondary | ICD-10-CM

## 2024-03-25 ENCOUNTER — Ambulatory Visit
Admission: RE | Admit: 2024-03-25 | Discharge: 2024-03-25 | Disposition: A | Discharge status: Home / Self Care | Source: Ambulatory Visit | Attending: Physician Assistant | Admitting: Physician Assistant

## 2024-03-25 DIAGNOSIS — M5441 Lumbago with sciatica, right side: Secondary | ICD-10-CM | POA: Diagnosis present

## 2024-03-25 DIAGNOSIS — G629 Polyneuropathy, unspecified: Secondary | ICD-10-CM | POA: Diagnosis present

## 2024-03-25 DIAGNOSIS — G8929 Other chronic pain: Secondary | ICD-10-CM | POA: Insufficient documentation

## 2024-03-25 DIAGNOSIS — R200 Anesthesia of skin: Secondary | ICD-10-CM | POA: Insufficient documentation

## 2024-03-25 DIAGNOSIS — R202 Paresthesia of skin: Secondary | ICD-10-CM | POA: Diagnosis present

## 2024-03-25 DIAGNOSIS — M5442 Lumbago with sciatica, left side: Secondary | ICD-10-CM | POA: Insufficient documentation

## 2024-03-25 DIAGNOSIS — R262 Difficulty in walking, not elsewhere classified: Secondary | ICD-10-CM | POA: Insufficient documentation

## 2024-03-25 DIAGNOSIS — R29898 Other symptoms and signs involving the musculoskeletal system: Secondary | ICD-10-CM | POA: Diagnosis present

## 2024-05-14 ENCOUNTER — Encounter: Payer: Self-pay | Admitting: Family Medicine
